# Patient Record
Sex: Male | Born: 1990 | Race: Black or African American | Hispanic: No | Marital: Single | Smoking: Never smoker
Health system: Southern US, Community
[De-identification: ages and names within clinical notes are randomized; demographics above are authoritative.]

## PROBLEM LIST (undated history)

## (undated) HISTORY — PX: OTHER SURGICAL HISTORY: SHX169

---
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
## 2010-07-15 ENCOUNTER — Encounter: Payer: Self-pay | Admitting: Family Medicine

## 2010-09-10 LAB — DIFFERENTIAL
Basophils Relative: 0 % (ref 0–1)
Eosinophils Absolute: 0.2 10*3/uL (ref 0.0–0.7)
Lymphs Abs: 0.6 10*3/uL — ABNORMAL LOW (ref 0.7–4.0)
Neutrophils Relative %: 82 % — ABNORMAL HIGH (ref 43–77)

## 2010-09-10 LAB — BASIC METABOLIC PANEL
BUN: 13 mg/dL (ref 6–23)
CO2: 26 mEq/L (ref 19–32)
Calcium: 8.8 mg/dL (ref 8.4–10.5)
Chloride: 106 mEq/L (ref 96–112)
Creatinine, Ser: 1.12 mg/dL (ref 0.4–1.5)
GFR calc Af Amer: >60 mL/min (ref >60)
GFR calc non Af Amer: >60 mL/min (ref >60)
Glucose, Bld: 100 mg/dL — ABNORMAL HIGH (ref 70–99)
Potassium: 3.6 mEq/L (ref 3.5–5.1)
Sodium: 137 mEq/L (ref 135–145)

## 2010-09-10 LAB — CBC
HCT: 39.5 % (ref 39.0–52.0)
Hemoglobin: 12.7 g/dL — ABNORMAL LOW (ref 13.0–17.0)
MCHC: 32.1 g/dL (ref 30.0–36.0)
MCV: 84 fL (ref 78.0–100.0)
Platelets: 157 10*3/uL (ref 150–400)
RBC: 4.7 MIL/uL (ref 4.22–5.81)
RDW: 14.6 % (ref 11.5–15.5)
WBC: 8.8 10*3/uL (ref 4.0–10.5)

## 2010-09-10 LAB — D-DIMER, QUANTITATIVE: D-Dimer, Quant: 0.24 ug/mL-FEU (ref 0.00–0.48)

## 2010-09-10 LAB — RAPID STREP SCREEN (MED CTR MEBANE ONLY): Streptococcus, Group A Screen (Direct): NEGATIVE

## 2010-09-10 LAB — CK TOTAL AND CKMB (NOT AT ARMC): Total CK: 278 U/L — ABNORMAL HIGH (ref 7–232)

## 2011-01-28 ENCOUNTER — Encounter: Payer: Self-pay | Admitting: *Deleted

## 2011-01-28 DIAGNOSIS — J029 Acute pharyngitis, unspecified: Secondary | ICD-10-CM | POA: Insufficient documentation

## 2011-01-28 NOTE — ED Notes (Signed)
Sore throat x 3 days

## 2011-01-29 ENCOUNTER — Emergency Department (HOSPITAL_BASED_OUTPATIENT_CLINIC_OR_DEPARTMENT_OTHER)
Admission: EM | Admit: 2011-01-29 | Discharge: 2011-01-29 | Disposition: A | Discharge status: Home / Self Care | Payer: Federal, State, Local not specified - PPO | Attending: Emergency Medicine | Admitting: Emergency Medicine

## 2011-01-29 DIAGNOSIS — J029 Acute pharyngitis, unspecified: Secondary | ICD-10-CM

## 2011-01-29 MED ORDER — LIDOCAINE HCL (PF) 1 % IJ SOLN
INTRAMUSCULAR | Status: AC
  Administered 2011-01-29: 2.1 mL via INTRAMUSCULAR
  Filled 2011-01-29: qty 5

## 2011-01-29 MED ORDER — DEXAMETHASONE SODIUM PHOSPHATE 10 MG/ML IJ SOLN
10.0000 mg | Freq: Once | INTRAMUSCULAR | Status: AC
  Administered 2011-01-29: 10 mg via INTRAMUSCULAR
  Filled 2011-01-29: qty 1

## 2011-01-29 MED ORDER — CEFTRIAXONE SODIUM 1 G IJ SOLR
1.0000 g | Freq: Once | INTRAMUSCULAR | Status: DC
  Filled 2011-01-29: qty 1

## 2011-01-29 MED ORDER — AMOXICILLIN-POT CLAVULANATE 875-125 MG PO TABS: 1.0000 | ORAL_TABLET | Freq: Two times a day (BID) | ORAL | Status: AC

## 2011-01-29 MED ORDER — DEXTROSE 5 % IV SOLN
INTRAVENOUS | Status: AC
  Administered 2011-01-29: 1 g via INTRAMUSCULAR
  Filled 2011-01-29: qty 1

## 2011-01-29 NOTE — ED Provider Notes (Signed)
History     CSN: [id number] Arrival date & time: No admission date for patient encounter.  Chief Complaint  Patient presents with  . Sore Throat   Patient is a 20 y.o. male presenting with pharyngitis. The history is provided by the patient.  Sore Throat This is a new problem. The current episode started more than 2 days ago (3 days ago). The problem occurs constantly. The problem has not changed since onset.Pertinent negatives include no chest pain, no abdominal pain, no headaches and no shortness of breath. The symptoms are relieved by nothing. He has tried nothing for the symptoms.  pt c/o sore throat for past 3 days. No acute or abrupt change today. Hurts to swallow but is able to swallow food/liquid without difficulty. No sob. Denies hx frequent sore throats. No known ill contacts. No known strep or mono exposure. No cough or sob. No nasal congestion or drainage. No fever or chills. No fb sensation. No abd or flank pain. No cp. No neck pain or stiffness. No headache.  Patient is a 20 year old male c/o sore throat onset 3 days ago and persistent since.   PAST MEDICAL HISTORY:  History reviewed. No pertinent past medical history.  PAST SURGICAL HISTORY:  History reviewed. No pertinent past surgical history.  MEDICATIONS:  Previous Medications   No medications on file     ALLERGIES:  Allergies as of 01/28/2011  . (No Known Allergies)     FAMILY HISTORY:  No family history on file.   SOCIAL HISTORY: History   Social History  . Marital Status: Single    Spouse Name: N/A    Number of Children: N/A  . Years of Education: N/A   Social History Main Topics  . Smoking status: Never Smoker   . Smokeless tobacco: None  . Alcohol Use: No  . Drug Use: No  . Sexually Active:    Other Topics Concern  . None   Social History Narrative  . None      Review of Systems  Constitutional: Negative for fever.  HENT: Negative for neck pain and neck stiffness.   Eyes: Negative  for pain.  Respiratory: Negative for shortness of breath.   Cardiovascular: Negative for chest pain.  Gastrointestinal: Negative for abdominal pain.  Musculoskeletal: Negative for back pain.  Skin: Negative for rash.  Neurological: Negative for headaches.  Hematological: Does not bruise/bleed easily.  Psychiatric/Behavioral: Negative for behavioral problems.   10 Systems reviewed and are negative for acute change except as noted in the HPI.  Physical Exam  BP 126/70  Pulse 89  Temp(Src) 99.3 F (37.4 C) (Oral)  Resp 20  SpO2 98%  Physical Exam  Nursing note and vitals reviewed. Constitutional: He is oriented to person, place, and time. He appears well-developed and well-nourished. No distress.  HENT:  Head: Atraumatic.       Pharynx erythematous. Mild symmetric swelling. No asymmetry, abscess, or trismus noted. Mild exudate.   Eyes: Pupils are equal, round, and reactive to light.  Neck: Neck supple. No tracheal deviation present.       Ant cerv l/a. No masses  Cardiovascular: Normal rate.   Pulmonary/Chest: Effort normal and breath sounds normal. No accessory muscle usage. No respiratory distress. He has no rales.       No increased wob, no stridor. No wheezing.   Abdominal: Soft. Bowel sounds are normal. He exhibits no distension and no mass. There is no tenderness.       No hsm  Musculoskeletal: Normal range of motion.  Neurological: He is alert and oriented to person, place, and time.       Voice normal  Skin: Skin is warm and dry.  Psychiatric: He has a normal mood and affect.    ED Course  Procedures  MDM Pt notes soreness/pain a bit worse on right side of throat. No gross asymmetry/abscess noted on exam. Discussed diff dx w pt including pharyngitis, possible early periton abscess. Will rx abx, and discussed w pt plan for ent referral and to call this am to arrange follow up there later today.   Rocephin and decadron in ed.    Results for orders placed during  the hospital encounter of 01/29/11  RAPID STREP SCREEN      Component Value Range   Streptococcus, Group A Screen (Direct) NEGATIVE  NEGATIVE       Suzi Roots, MD 01/29/11 [phone]

## 2011-01-29 NOTE — ED Notes (Signed)
No reaction noted to ABX. Pt discharged home. 

## 2011-01-29 NOTE — ED Provider Notes (Signed)
History     CSN: [id number] Arrival date & time: No admission date for patient encounter.  Chief Complaint  Patient presents with  . Sore Throat   HPI  History reviewed. No pertinent past medical history.  History reviewed. No pertinent past surgical history.  No family history on file.  History  Substance Use Topics  . Smoking status: Never Smoker   . Smokeless tobacco: Not on file  . Alcohol Use: No      Review of Systems  Physical Exam  BP 126/70  Pulse 89  Temp(Src) 99.3 F (37.4 C) (Oral)  Resp 20  SpO2 98%  Physical Exam  ED Course  Procedures  MDM       Suzi Roots, MD 01/29/11 [phone]

## 2011-01-29 NOTE — ED Notes (Signed)
MD at bedside. DR Steinl at bedside. 

## 2011-01-29 NOTE — ED Notes (Signed)
Pt reports sore throat x4 days. Denies any other associated symptoms. Tonsils are enlarged during exam in room.

## 2011-02-05 ENCOUNTER — Encounter (HOSPITAL_BASED_OUTPATIENT_CLINIC_OR_DEPARTMENT_OTHER): Payer: Self-pay | Admitting: *Deleted

## 2011-02-05 ENCOUNTER — Emergency Department (INDEPENDENT_AMBULATORY_CARE_PROVIDER_SITE_OTHER): Payer: Federal, State, Local not specified - PPO

## 2011-02-05 ENCOUNTER — Emergency Department (HOSPITAL_BASED_OUTPATIENT_CLINIC_OR_DEPARTMENT_OTHER)
Admission: EM | Admit: 2011-02-05 | Discharge: 2011-02-05 | Disposition: A | Discharge status: Home / Self Care | Payer: Federal, State, Local not specified - PPO | Attending: Emergency Medicine | Admitting: Emergency Medicine

## 2011-02-05 DIAGNOSIS — J36 Peritonsillar abscess: Secondary | ICD-10-CM | POA: Insufficient documentation

## 2011-02-05 DIAGNOSIS — R1310 Dysphagia, unspecified: Secondary | ICD-10-CM | POA: Insufficient documentation

## 2011-02-05 DIAGNOSIS — J351 Hypertrophy of tonsils: Secondary | ICD-10-CM

## 2011-02-05 LAB — DIFFERENTIAL
Eosinophils Absolute: 0.2 10*3/uL (ref 0.0–0.7)
Eosinophils Relative: 2 % (ref 0–5)
Lymphs Abs: 1.4 10*3/uL (ref 0.7–4.0)
Monocytes Relative: 8 % (ref 3–12)

## 2011-02-05 LAB — CBC
HCT: 38.6 % — ABNORMAL LOW (ref 39.0–52.0)
Hemoglobin: 12.6 g/dL — ABNORMAL LOW (ref 13.0–17.0)
MCH: 26.1 pg (ref 26.0–34.0)
MCV: 79.9 fL (ref 78.0–100.0)
RBC: 4.83 MIL/uL (ref 4.22–5.81)

## 2011-02-05 LAB — BASIC METABOLIC PANEL
BUN: 12 mg/dL (ref 6–23)
Calcium: 9.6 mg/dL (ref 8.4–10.5)
GFR calc non Af Amer: >60 mL/min (ref >60)
Glucose, Bld: 104 mg/dL — ABNORMAL HIGH (ref 70–99)
Potassium: 3.9 mEq/L (ref 3.5–5.1)

## 2011-02-05 MED ORDER — CLINDAMYCIN PHOSPHATE 900 MG/50ML IV SOLN
900.0000 mg | Freq: Once | INTRAVENOUS | Status: AC
  Administered 2011-02-05: 900 mg via INTRAVENOUS
  Filled 2011-02-05: qty 50

## 2011-02-05 MED ORDER — HYDROCODONE-ACETAMINOPHEN 5-500 MG PO TABS
1.0000 | ORAL_TABLET | Freq: Four times a day (QID) | ORAL | Status: AC | PRN
Start: 2011-02-05 — End: 2011-02-15

## 2011-02-05 MED ORDER — SODIUM CHLORIDE 0.9 % IV BOLUS (SEPSIS): 1000.0000 mL | Freq: Once | INTRAVENOUS | Status: DC

## 2011-02-05 MED ORDER — DEXAMETHASONE SODIUM PHOSPHATE 4 MG/ML IJ SOLN
10.0000 mg | Freq: Once | INTRAMUSCULAR | Status: AC
  Administered 2011-02-05: 10 mg via INTRAVENOUS
  Filled 2011-02-05: qty 2
  Filled 2011-02-05: qty 1

## 2011-02-05 MED ORDER — IOHEXOL 300 MG/ML  SOLN
80.0000 mL | Freq: Once | INTRAMUSCULAR | Status: AC | PRN
  Administered 2011-02-05: 80 mL via INTRAVENOUS

## 2011-02-05 MED ORDER — MORPHINE SULFATE 4 MG/ML IJ SOLN
4.0000 mg | Freq: Once | INTRAMUSCULAR | Status: AC
  Administered 2011-02-05: 4 mg via INTRAVENOUS
  Filled 2011-02-05: qty 1

## 2011-02-05 MED ORDER — SODIUM CHLORIDE 0.9 % IV BOLUS (SEPSIS)
1000.0000 mL | Freq: Once | INTRAVENOUS | Status: DC
  Administered 2011-02-05: 1000 mL via INTRAVENOUS

## 2011-02-05 MED ORDER — CLINDAMYCIN HCL 150 MG PO CAPS: 450.0000 mg | ORAL_CAPSULE | Freq: Three times a day (TID) | ORAL | Status: AC

## 2011-02-05 NOTE — ED Notes (Signed)
Pt sipping on clear fluids. Able to tolerate small sips at a time

## 2011-02-05 NOTE — ED Provider Notes (Signed)
History     CSN: [id number] Arrival date & time: 02/05/2011  4:48 AM  Chief Complaint  Patient presents with  . Sore Throat   HPI Comments: 20yoM previously healthy pw sore throat. Pt c/o sore throat x over one week. Seen here previously for same with negative rapid strep. Given decadron and prescribed augmentin. Did not take 2/2 inability to swallow pills. C/o worsening pain and swelling > right side since yesterday. Feels short of breath 2/2 feeling like "throat is closing". Denies noisy breathing. Tolerating PO liquids, having more difficulty with solids. +muffled voice, no trismus. Denies h/o DM, HIV, immunocompromise. No neck stiffness, denies fever, no vomiting  Patient is a 20 y.o. male presenting with pharyngitis.  Sore Throat    History reviewed. No pertinent past medical history.  History reviewed. No pertinent past surgical history.  No family history on file.  History  Substance Use Topics  . Smoking status: Never Smoker   . Smokeless tobacco: Not on file  . Alcohol Use: No      Review of Systems  All other systems reviewed and are negative.  except as noted HPI   Physical Exam  BP 131/68  Pulse 92  Temp(Src) 98.7 F (37.1 C) (Oral)  Resp 18  SpO2 98%  Physical Exam  Nursing note and vitals reviewed. Constitutional: He is oriented to person, place, and time. He appears well-developed and well-nourished. No distress.  HENT:  Head: Atraumatic.  Mouth/Throat: Oropharyngeal exudate present.       3+ tonsillar swelling R>L uvula deviated to left +erythema B/l tender submandibular LAD No stridor No trismus-- 3 finger breadths  Eyes: Conjunctivae and EOM are normal. Pupils are equal, round, and reactive to light.  Neck: Neck supple.  Cardiovascular: Normal rate, regular rhythm, normal heart sounds and intact distal pulses.  Exam reveals no gallop and no friction rub.   No murmur heard. Pulmonary/Chest: Effort normal. No respiratory distress. He has no  wheezes. He has no rales.  Abdominal: Soft. Bowel sounds are normal. There is no tenderness. There is no rebound and no guarding.  Musculoskeletal: Normal range of motion. He exhibits no edema and no tenderness.  Neurological: He is alert and oriented to person, place, and time.  Skin: Skin is warm and dry.  Psychiatric: He has a normal mood and affect.   *RADIOLOGY REPORT*  Clinical Data: Difficulty swallowing and feels like airways  closing. Treated with antibiotics several weeks ago. Evaluate for  right tonsillar abscess.  CT NECK WITH CONTRAST  Technique: Multidetector CT imaging of the neck was performed with  intravenous contrast.  Contrast: 80 ml Omnipaque-300  Comparison: None.  Findings: There is diffuse tonsillar swelling, more prominent on  the right. Small circumscribed hypodense foci are demonstrated  within the right tonsil with mild peripheral enhancement. These  measure about 11 mm and 5 mm diameter. These are consistent with  early abscess formation. There is infiltration into the adjacent  cervical fat consistent with inflammatory change. Mildly prominent  cervical lymph nodes demonstrated diffusely and bilaterally  consistent with reactive nodes. In addition, there is a small  circumscribed fluid collection along the midline just above the  hyoid bone measuring about 10 mm diameter. This could represent  inflammatory or necrotic lymph node or a preexisting small  thyroglossal cyst. The cervical carotid and jugular vessels appear  patent. Salivary glands appear symmetrical. Visualized paranasal  sinuses are not opacified. Thyroid gland is intact with  homogeneous parenchymal enhancement.  IMPRESSION:  Diffuse tonsillar enlargement, more prominent on the right, with  small developing circumscribed fluid collections in the right  tonsils suggesting small abscesses. There is another small cystic  collection anterior to the hyoid bone which might represent a  cystic  lymph node or preexisting thyroglossal cyst.  Original Report Authenticated By: Marlon Pel, M.D.   ED Course  Procedures  MDM 20yoM here with persistent sore throat and worsening swelling. Will check CT evaluate for peritonsillar abscess. Decadron. IVF, pain control, reassess 6:20 AM  CT scan and labs reviewed. Mild leukocytosis. +Two small R peritonsillar abscesses. Discussed with ENT Dr. Pollyann Kennedy. Will give clindamycin here. He will see patient in the office this morning at 0900am for further evaluation and possible drainage assuming pt can tolerate PO fluids. Patient is comfortable with this plan for follow up.  6:52 AM  Pain down to 4/10. Tolerating PO fluids, states he feels much better. Father coming to pick him up and will take him to his morning appointment with ENT  Eden Lathe, MD 02/05/11 [phone]

## 2011-02-05 NOTE — ED Notes (Signed)
Pt states he is here tonight bc he feels like his airway is closing. Pt states he was prescribed Augmentin when he was seen here over a week ago, which he did not finish taking "bc it hurt too much to swallow them".

## 2011-02-05 NOTE — ED Notes (Signed)
Pt seen here a couple weeks ago for sore throat (that he's had for three weeks) and given steroid injection. States pain has worsened, and he can barely swallow liquids. Pt has difficulty speaking d/t pain. Pt c/o "knot" to right side of throat.

## 2011-02-05 NOTE — ED Notes (Signed)
Pt states that he feels like he cant breath and his tonsils on observation per RN were severely swollen.

## 2011-02-06 ENCOUNTER — Ambulatory Visit (INDEPENDENT_AMBULATORY_CARE_PROVIDER_SITE_OTHER): Payer: Federal, State, Local not specified - PPO | Admitting: *Deleted

## 2011-02-06 DIAGNOSIS — Z23 Encounter for immunization: Secondary | ICD-10-CM

## 2011-02-06 DIAGNOSIS — Z111 Encounter for screening for respiratory tuberculosis: Secondary | ICD-10-CM

## 2011-02-11 NOTE — Patient Instructions (Signed)
Pt didn't return for ppd reading

## 2011-04-29 ENCOUNTER — Encounter (HOSPITAL_BASED_OUTPATIENT_CLINIC_OR_DEPARTMENT_OTHER): Payer: Self-pay | Admitting: *Deleted

## 2011-04-29 ENCOUNTER — Emergency Department (HOSPITAL_BASED_OUTPATIENT_CLINIC_OR_DEPARTMENT_OTHER)
Admission: EM | Admit: 2011-04-29 | Discharge: 2011-04-29 | Disposition: A | Discharge status: Home / Self Care | Payer: Federal, State, Local not specified - PPO | Attending: Emergency Medicine | Admitting: Emergency Medicine

## 2011-04-29 DIAGNOSIS — J039 Acute tonsillitis, unspecified: Secondary | ICD-10-CM

## 2011-04-29 DIAGNOSIS — J020 Streptococcal pharyngitis: Secondary | ICD-10-CM | POA: Insufficient documentation

## 2011-04-29 LAB — CBC
HCT: 39.7 % (ref 39.0–52.0)
MCH: 25.9 pg — ABNORMAL LOW (ref 26.0–34.0)
MCV: 79.7 fL (ref 78.0–100.0)
Platelets: 240 10*3/uL (ref 150–400)
RBC: 4.98 MIL/uL (ref 4.22–5.81)

## 2011-04-29 LAB — DIFFERENTIAL
Eosinophils Absolute: 0 10*3/uL (ref 0.0–0.7)
Eosinophils Relative: 0 % (ref 0–5)
Lymphs Abs: 0.9 10*3/uL (ref 0.7–4.0)
Monocytes Absolute: 1.4 10*3/uL — ABNORMAL HIGH (ref 0.1–1.0)

## 2011-04-29 MED ORDER — DEXAMETHASONE SODIUM PHOSPHATE 10 MG/ML IJ SOLN
10.0000 mg | Freq: Once | INTRAMUSCULAR | Status: AC
  Administered 2011-04-29: 10 mg via INTRAVENOUS
  Filled 2011-04-29: qty 1

## 2011-04-29 MED ORDER — MORPHINE SULFATE 4 MG/ML IJ SOLN
4.0000 mg | Freq: Once | INTRAMUSCULAR | Status: AC
  Administered 2011-04-29: 4 mg via INTRAVENOUS
  Filled 2011-04-29: qty 1

## 2011-04-29 MED ORDER — SODIUM CHLORIDE 0.9 % IV SOLN
Freq: Once | INTRAVENOUS | Status: AC
  Administered 2011-04-29: 21:00:00 via INTRAVENOUS

## 2011-04-29 MED ORDER — OXYCODONE-ACETAMINOPHEN 5-325 MG PO TABS: 2.0000 | ORAL_TABLET | ORAL | Status: AC | PRN

## 2011-04-29 MED ORDER — CEPHALEXIN 500 MG PO CAPS: 500.0000 mg | ORAL_CAPSULE | Freq: Four times a day (QID) | ORAL | Status: AC

## 2011-04-29 MED ORDER — PREDNISONE 10 MG PO TABS: 20.0000 mg | ORAL_TABLET | Freq: Every day | ORAL | Status: AC

## 2011-04-29 MED ORDER — DEXTROSE 5 % IV SOLN
1.0000 g | Freq: Once | INTRAVENOUS | Status: AC
  Administered 2011-04-29: 1 g via INTRAVENOUS
  Filled 2011-04-29: qty 10

## 2011-04-29 MED ORDER — HYDROCODONE-ACETAMINOPHEN 5-500 MG PO TABS: 1.0000 | ORAL_TABLET | Freq: Four times a day (QID) | ORAL | Status: DC | PRN

## 2011-04-29 NOTE — ED Provider Notes (Signed)
History    Scribed for Geoffery Lyons, MD, the patient was seen in room MH04/MH04. This chart was scribed by Katha Cabal.   CSN: [id number] Arrival date & time: 04/29/2011  8:18 PM   First MD Initiated Contact with Patient 04/29/11 2020      Chief Complaint  Patient presents with  . Sore Throat    (Consider location/radiation/quality/duration/timing/severity/associated sxs/prior treatment) HPI Nicholas Shaw is a 20 y.o. male who presents to the Emergency Department complaining of new episode of recurrent persistent moderate to severe sore throat episode for 2 days.  Sore throat associated with difficulty swallowing and throat pain.  Symptoms are gradually worsening.  Relieved by nothing.  Brother reports 2 prior episodes.  Last episode was a month ago with first episode was about a week before.  Patient was given antibiotics and steroids at last visit that provided relief.  Patient has no history of serious medical conditions.   PCP Willow Ora, MD, MD   History reviewed. No pertinent past medical history.  History reviewed. No pertinent past surgical history.  No family history on file.  History  Substance Use Topics  . Smoking status: Never Smoker   . Smokeless tobacco: Not on file  . Alcohol Use: No      Review of Systems  All other systems reviewed and are negative.    Allergies  Review of patient's allergies indicates no known allergies.  Home Medications  No current outpatient prescriptions on file.  BP 135/56  Pulse 96  Temp(Src) 98.9 F (37.2 C) (Oral)  Resp 16  SpO2 96%  Physical Exam  Constitutional: He is oriented to person, place, and time. He appears well-developed and well-nourished.  HENT:  Head: Normocephalic and atraumatic.  Mouth/Throat: Uvula is midline. Mucous membranes are not dry. Oropharyngeal exudate, posterior oropharyngeal edema and posterior oropharyngeal erythema present.       Markedly enlarged tonsils bilaterally, Symmetrical  tonsils,   Eyes: Conjunctivae and EOM are normal.  Cardiovascular: Normal rate and regular rhythm.   Pulmonary/Chest: Effort normal and breath sounds normal. No respiratory distress.  Abdominal: There is no tenderness. There is no rebound and no guarding.  Musculoskeletal: Normal range of motion.  Neurological: He is alert and oriented to person, place, and time.  Skin: Skin is warm and dry. No rash noted.  Psychiatric: He has a normal mood and affect. His behavior is normal.    ED Course  Procedures (including critical care time)   DIAGNOSTIC STUDIES: Oxygen Saturation is 98% on room air, normal by my interpretation.    COORDINATION OF CARE:  8:43 PM  Physical exam complete. Swabbed throat for rapid strep.   9:35 PM  Patient with positive rapid strep. Patient advised of result.   10:18 PM  Recheck.  Patient reports improvement.  Plan to discharge patient. Patient agrees with plan.    Orders Placed This Encounter  Procedures  . Rapid strep screen  . CBC  . Differential     LABS / RADIOLOGY: Labs Reviewed  RAPID STREP SCREEN - Abnormal; Notable for the following:    Streptococcus, Group A Screen (Direct) POSITIVE (*)    All other components within normal limits  CBC - Abnormal; Notable for the following:    WBC 15.7 (*)    Hemoglobin 12.9 (*)    MCH 25.9 (*)    All other components within normal limits  DIFFERENTIAL - Abnormal; Notable for the following:    Neutrophils Relative 85 (*)    Neutro  Abs 13.4 (*)    Lymphocytes Relative 6 (*)    Monocytes Absolute 1.4 (*)    All other components within normal limits   No results found.       MDM   MDM: Patient with significant tonsillar swelling and exudates.  Was given iv antibiotics, steroids, feeling better.  Will discharge with steroids, antibiotics, and pain meds.  Needs to follow up with ENT.       MEDICATIONS GIVEN IN THE E.D. Scheduled Meds:    . sodium chloride   Intravenous Once  . dexamethasone   10 mg Intravenous Once  .  morphine injection  4 mg Intravenous Once   Continuous Infusions:    . cefTRIAXone (ROCEPHIN) IV         IMPRESSION: 1. Strep pharyngitis   2. Tonsillitis      DISCHARGE MEDICATIONS: New Prescriptions   No medications on file      I personally performed the services described in this documentation, which was scribed in my presence. The recorded information has been reviewed and considered.  Scribe            Geoffery Lyons, MD 04/30/11 1534

## 2011-04-29 NOTE — ED Notes (Signed)
Pt c/o sore throat x2 days 

## 2012-01-01 ENCOUNTER — Encounter: Payer: Self-pay | Admitting: Internal Medicine

## 2012-01-01 ENCOUNTER — Ambulatory Visit (INDEPENDENT_AMBULATORY_CARE_PROVIDER_SITE_OTHER): Payer: Federal, State, Local not specified - PPO | Admitting: Internal Medicine

## 2012-01-01 VITALS — BP 132/78 | HR 59 | Temp 99.0°F | Wt 295.8 lb

## 2012-01-01 DIAGNOSIS — IMO0002 Reserved for concepts with insufficient information to code with codable children: Secondary | ICD-10-CM

## 2012-01-01 MED ORDER — CYCLOBENZAPRINE HCL 5 MG PO TABS: 5.0000 mg | ORAL_TABLET | Freq: Three times a day (TID) | ORAL | Status: AC | PRN

## 2012-01-01 MED ORDER — TRAMADOL HCL 50 MG PO TABS: 50.0000 mg | ORAL_TABLET | Freq: Four times a day (QID) | ORAL | Status: AC | PRN

## 2012-01-01 NOTE — Patient Instructions (Addendum)
Please remain out of work until  01/02/2012. Stretching and warming up prior to weight lifting.  The best exercises for the low back include freestyle swimming, stretch aerobics, and yoga.

## 2012-01-01 NOTE — Progress Notes (Signed)
  Subjective:    Patient ID: Nicholas Shaw, male    DOB: [birth year], 21 y.o.   MRN: [id number]  HPI BACK PAIN: He experienced the acute onset of left flank pain this morning after dead lifting approximately 215 pounds. The pain is described as sharp, non radiating and intermittent. He has not treated this in any fashion. He had a similar episode on one occasion after wrestling.  His past medical history is totally negative; specifically no history of renal calculi    Review of Systems There's been no associated stool urinary incontinence, numbness, extremity weakness, fever, chills,or sweats. He also denies hematuria, pyuria, or dysuria.     Objective:   Physical Exam   He appears healthy and well-nourished  He exhibits a bradycardia with a regular rhythm.  Chest is clear the bases with no splinting  He is tender to palpation over the left posterior flank.  Bowel sounds are normal; he exhibits no tenderness, masses, organomegaly  Is able to lie flat and sit up without help. Straight leg raising is negative.  Deep tendon reflexes, strength, and tone are all normal. Gait, tiptoe walking and heel walking are all normal.        Assessment & Plan:  #1 acute left flank pain in the context of weight lifting. Clinically no suggestion of acute disc, intra-abdominal process or nephrolithiasis.  Plan: See orders and recommendations

## 2013-07-29 ENCOUNTER — Encounter (HOSPITAL_COMMUNITY): Payer: Self-pay | Admitting: Emergency Medicine

## 2013-07-29 ENCOUNTER — Emergency Department (HOSPITAL_COMMUNITY)
Admission: EM | Admit: 2013-07-29 | Discharge: 2013-07-29 | Disposition: A | Discharge status: Home / Self Care | Payer: Federal, State, Local not specified - PPO | Attending: Emergency Medicine | Admitting: Emergency Medicine

## 2013-07-29 DIAGNOSIS — R42 Dizziness and giddiness: Secondary | ICD-10-CM | POA: Insufficient documentation

## 2013-07-29 DIAGNOSIS — R51 Headache: Secondary | ICD-10-CM | POA: Insufficient documentation

## 2013-07-29 DIAGNOSIS — K5289 Other specified noninfective gastroenteritis and colitis: Secondary | ICD-10-CM | POA: Insufficient documentation

## 2013-07-29 DIAGNOSIS — R6883 Chills (without fever): Secondary | ICD-10-CM | POA: Insufficient documentation

## 2013-07-29 DIAGNOSIS — R0602 Shortness of breath: Secondary | ICD-10-CM | POA: Insufficient documentation

## 2013-07-29 DIAGNOSIS — K529 Noninfective gastroenteritis and colitis, unspecified: Secondary | ICD-10-CM

## 2013-07-29 LAB — COMPREHENSIVE METABOLIC PANEL
ALBUMIN: 3.8 g/dL (ref 3.5–5.2)
ALT: 21 U/L (ref 0–53)
AST: 21 U/L (ref 0–37)
Alkaline Phosphatase: 53 U/L (ref 39–117)
BILIRUBIN TOTAL: 0.6 mg/dL (ref 0.3–1.2)
BUN: 16 mg/dL (ref 6–23)
CHLORIDE: 100 meq/L (ref 96–112)
CO2: 24 mEq/L (ref 19–32)
CREATININE: 1.05 mg/dL (ref 0.50–1.35)
Calcium: 8.7 mg/dL (ref 8.4–10.5)
GFR calc non Af Amer: >90 mL/min (ref >90)
GLUCOSE: 110 mg/dL — AB (ref 70–99)
Potassium: 4.6 mEq/L (ref 3.7–5.3)
Sodium: 136 mEq/L — ABNORMAL LOW (ref 137–147)
Total Protein: 7.4 g/dL (ref 6.0–8.3)

## 2013-07-29 LAB — CBC WITH DIFFERENTIAL/PLATELET
Basophils Absolute: 0 10*3/uL (ref 0.0–0.1)
Basophils Relative: 0 % (ref 0–1)
Eosinophils Absolute: 0.1 10*3/uL (ref 0.0–0.7)
Eosinophils Relative: 1 % (ref 0–5)
HCT: 43.4 % (ref 39.0–52.0)
Hemoglobin: 14.4 g/dL (ref 13.0–17.0)
Lymphocytes Relative: 3 % — ABNORMAL LOW (ref 12–46)
Lymphs Abs: 0.3 10*3/uL — ABNORMAL LOW (ref 0.7–4.0)
MCH: 27.6 pg (ref 26.0–34.0)
MCHC: 33.2 g/dL (ref 30.0–36.0)
MCV: 83.1 fL (ref 78.0–100.0)
Monocytes Absolute: 1.1 10*3/uL — ABNORMAL HIGH (ref 0.1–1.0)
Monocytes Relative: 11 % (ref 3–12)
Neutro Abs: 8.3 10*3/uL — ABNORMAL HIGH (ref 1.7–7.7)
Neutrophils Relative %: 85 % — ABNORMAL HIGH (ref 43–77)
Platelets: 166 10*3/uL (ref 150–400)
RBC: 5.22 MIL/uL (ref 4.22–5.81)
RDW: 13.5 % (ref 11.5–15.5)
WBC: 9.7 10*3/uL (ref 4.0–10.5)

## 2013-07-29 LAB — URINALYSIS, ROUTINE W REFLEX MICROSCOPIC
Bilirubin Urine: NEGATIVE
Glucose, UA: NEGATIVE mg/dL
Hgb urine dipstick: NEGATIVE
Ketones, ur: NEGATIVE mg/dL
Leukocytes, UA: NEGATIVE
Nitrite: NEGATIVE
Protein, ur: NEGATIVE mg/dL
Specific Gravity, Urine: 1.03 (ref 1.005–1.030)
Urobilinogen, UA: 0.2 mg/dL (ref 0.0–1.0)
pH: 7 (ref 5.0–8.0)

## 2013-07-29 LAB — LIPASE, BLOOD: LIPASE: 32 U/L (ref 11–59)

## 2013-07-29 MED ORDER — SODIUM CHLORIDE 0.9 % IV BOLUS (SEPSIS)
500.0000 mL | INTRAVENOUS | Status: AC
  Administered 2013-07-29: 500 mL via INTRAVENOUS

## 2013-07-29 MED ORDER — PROMETHAZINE HCL 25 MG PO TABS: 25.0000 mg | ORAL_TABLET | Freq: Three times a day (TID) | ORAL | Status: DC | PRN

## 2013-07-29 MED ORDER — SODIUM CHLORIDE 0.9 % IV BOLUS (SEPSIS)
2000.0000 mL | Freq: Once | INTRAVENOUS | Status: AC
  Administered 2013-07-29: 2000 mL via INTRAVENOUS

## 2013-07-29 MED ORDER — ONDANSETRON HCL 4 MG/2ML IJ SOLN
4.0000 mg | Freq: Once | INTRAMUSCULAR | Status: AC
  Administered 2013-07-29: 4 mg via INTRAVENOUS
  Filled 2013-07-29: qty 2

## 2013-07-29 NOTE — Discharge Instructions (Signed)
Return here as needed.  Follow-up with your primary doctor.  Slowly increase your fluid intake. °

## 2013-07-29 NOTE — ED Provider Notes (Signed)
CSN: [id number]     Arrival date & time 07/29/13  0556 History   First MD Initiated Contact with Patient 07/29/13 [phone]     Chief Complaint  Patient presents with  . Abdominal Pain  . Emesis  . Diarrhea   (Consider location/radiation/quality/duration/timing/severity/associated sxs/prior Treatment) HPI 23 yo black male presents with chief complaint of diarrhea and emesis that started at midnight, 6.5 hours ago. He went to a [place] buff[place] with his family around 2 pm and started having abdominal pain around 9 pm, which progressed to vomiting at midnight and diarrhea shortly thereafter. He reports non-bloody vomiting every 10-15 minutes and having non-bloody diarrhea. Patient describes the abdominal pain as a 10/10 on the pain scale and has taken 200 mg Acetaminophen for relief. He also endorses headache, dizziness.   History reviewed. No pertinent past medical history. Past Surgical History  Procedure Laterality Date  . Negative     Family History  Problem Relation Age of Onset  . Hypertension Other    History  Substance Use Topics  . Smoking status: Never Smoker   . Smokeless tobacco: Not on file  . Alcohol Use: Yes     Comment: occ    Review of Systems  Constitutional: Positive for chills.  Eyes: Negative.   Respiratory: Positive for shortness of breath.   Cardiovascular: Negative for chest pain.  Gastrointestinal: Positive for nausea, vomiting, abdominal pain and diarrhea. Negative for constipation, blood in stool and anal bleeding.  Neurological: Positive for dizziness, light-headedness and headaches.    Allergies  Review of patient's allergies indicates no known allergies.  Home Medications  No current outpatient prescriptions on file. BP 133/59  Pulse 101  Temp(Src) 99.7 F (37.6 C) (Oral)  Resp 16  SpO2 97% Physical Exam  Constitutional: He appears well-developed and well-nourished. He appears distressed.  HENT:  Head: Normocephalic and atraumatic.   Mouth/Throat: No oropharyngeal exudate.  Eyes: Pupils are equal, round, and reactive to light.  Cardiovascular: Normal rate, regular rhythm and normal heart sounds.  Exam reveals no gallop and no friction rub.   No murmur heard. Pulmonary/Chest: Effort normal and breath sounds normal. He has no wheezes. He has no rhonchi. He has no rales. He exhibits no tenderness.  Abdominal: He exhibits abdominal bruit. Bowel sounds are decreased. There is no hepatosplenomegaly. There is tenderness in the epigastric area and left upper quadrant. There is no rebound, no guarding, no CVA tenderness, no tenderness at McBurney's point and negative Murphy's sign.    ED Course  Procedures (including critical care   Patient is feeling dramatically better following IV fluids.  Told to return here as needed.  Slowly increase his fluid intake At home.vision is a viral gastroenteritis, based on his history of present illness and physical exam findings  Carlyle DollyChristopher W Avey Mcmanamon, PA-C 07/30/13 1621

## 2013-07-29 NOTE — ED Notes (Signed)
Pt alert and oriented x4. Respirations even and unlabored, bilateral symmetrical rise and fall of chest. Skin warm and dry. In no acute distress. Denies needs.   

## 2013-07-29 NOTE — ED Notes (Signed)
Pt states since about midnight last night pt has had nausea, vomiting, diarrhea, and abd pain

## 2013-07-29 NOTE — ED Notes (Signed)
Pt escorted to discharge window. Pt verbalized understanding discharge instructions. In no acute distress.  

## 2013-07-29 NOTE — ED Notes (Signed)
Pt tolerated PO fuilds

## 2013-08-01 NOTE — ED Provider Notes (Signed)
Medical screening examination/treatment/procedure(s) were performed by non-physician practitioner and as supervising physician I was immediately available for consultation/collaboration.  EKG Interpretation   None         Britny Riel, MD 08/01/13 0854 

## 2013-08-11 ENCOUNTER — Ambulatory Visit: Payer: Federal, State, Local not specified - PPO | Admitting: Internal Medicine

## 2013-08-11 DIAGNOSIS — Z0289 Encounter for other administrative examinations: Secondary | ICD-10-CM

## 2013-12-03 ENCOUNTER — Ambulatory Visit: Payer: Federal, State, Local not specified - PPO | Admitting: Physician Assistant

## 2013-12-03 ENCOUNTER — Ambulatory Visit (INDEPENDENT_AMBULATORY_CARE_PROVIDER_SITE_OTHER): Payer: Federal, State, Local not specified - PPO | Admitting: Physician Assistant

## 2013-12-03 VITALS — BP 132/84 | HR 85 | Temp 98.2°F | Resp 17 | Ht 72.0 in | Wt 282.0 lb

## 2013-12-03 DIAGNOSIS — R059 Cough, unspecified: Secondary | ICD-10-CM

## 2013-12-03 DIAGNOSIS — J029 Acute pharyngitis, unspecified: Secondary | ICD-10-CM

## 2013-12-03 DIAGNOSIS — Z0289 Encounter for other administrative examinations: Secondary | ICD-10-CM

## 2013-12-03 DIAGNOSIS — R05 Cough: Secondary | ICD-10-CM

## 2013-12-03 DIAGNOSIS — J069 Acute upper respiratory infection, unspecified: Secondary | ICD-10-CM

## 2013-12-03 LAB — POCT CBC
GRANULOCYTE PERCENT: 65.2 % (ref 37–80)
HEMATOCRIT: 41.9 % — AB (ref 43.5–53.7)
HEMOGLOBIN: 12.9 g/dL — AB (ref 14.1–18.1)
Lymph, poc: 1.4 (ref 0.6–3.4)
MCH: 26.4 pg — AB (ref 27–31.2)
MCHC: 30.8 g/dL — AB (ref 31.8–35.4)
MCV: 85.8 fL (ref 80–97)
MID (CBC): 0.7 (ref 0–0.9)
MPV: 11.4 fL (ref 0–99.8)
PLATELET COUNT, POC: 188 10*3/uL (ref 142–424)
POC Granulocyte: 4 (ref 2–6.9)
POC LYMPH PERCENT: 23.5 %L (ref 10–50)
POC MID %: 11.3 %M (ref 0–12)
RBC: 4.88 M/uL (ref 4.69–6.13)
RDW, POC: 14.4 %
WBC: 6.1 10*3/uL (ref 4.6–10.2)

## 2013-12-03 LAB — POCT INFLUENZA A/B
INFLUENZA A, POC: NEGATIVE
INFLUENZA B, POC: NEGATIVE

## 2013-12-03 LAB — POCT RAPID STREP A (OFFICE): Rapid Strep A Screen: NEGATIVE

## 2013-12-03 MED ORDER — AZITHROMYCIN 250 MG PO TABS: ORAL_TABLET | ORAL | Status: DC

## 2013-12-03 MED ORDER — HYDROCOD POLST-CHLORPHEN POLST 10-8 MG/5ML PO LQCR: 5.0000 mL | Freq: Two times a day (BID) | ORAL | Status: DC | PRN

## 2013-12-03 MED ORDER — IPRATROPIUM BROMIDE 0.06 % NA SOLN: 2.0000 | Freq: Three times a day (TID) | NASAL | Status: DC

## 2013-12-03 NOTE — Progress Notes (Signed)
Subjective:    Patient ID: Nicholas Shaw, male    DOB: [birth year], 23 y.o.   MRN: [id number]  HPI Primary Physician: Willow OraJose Paz, MD  Chief Complaint: URI x 2-3 days  HPI: 23 y.o. male with history below presents with 2-3 day history of nasal congestion, rhinorrhea, post nasal drip, muffled hearing, sinus pressure, headache, subjective fever, chills, fatigue, and myalgias. Cough is productive of yellowish-green sputum. Some SOB and wheezing. No sore throat prior to the development of the cough. Headache is located along the bilateral temples. He did not get a flu vaccine this year.   Patient was doing some working out prior to the above. He was lifting some weights, biceps, chest, back, and legs. He also sat in the sauna and ran for about 4 miles, this took him 20 minutes he reports. Urine has been looking clear.   He does not know of any known sick contacts. He has tried Catering managerAlka Seltzer powder and pills. Has also tried Tylenol. He last took medication around 10 AM today.   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    History reviewed. No pertinent past medical history.   Home Meds: Prior to Admission medications   Not on File    Allergies: No Known Allergies  History   Social History  . Marital Status: Single    Spouse Name: N/A    Number of Children: N/A  . Years of Education: N/A   Occupational History  . Not on file.   Social History Main Topics  . Smoking status: Never Smoker   . Smokeless tobacco: Not on file  . Alcohol Use: Yes     Comment: occ  . Drug Use: No  . Sexual Activity: No   Other Topics Concern  . Not on file   Social History Narrative  . No narrative on file     Review of Systems  Constitutional: Positive for fever, chills and fatigue. Negative for appetite change.  HENT: Positive for congestion, hearing loss, postnasal drip, rhinorrhea, sinus pressure, sneezing and sore throat. Negative for ear pain.        Nasal  congestion is yellowish-green.   Eyes: Negative for photophobia, pain, discharge, redness, itching and visual disturbance.  Respiratory: Positive for cough, shortness of breath and wheezing.        Cough is secondary to post nasal drip. Cough is causing a sore throat. No ST prior to cough.   Gastrointestinal: Positive for diarrhea. Negative for nausea and vomiting.  Musculoskeletal: Positive for myalgias.  Neurological: Positive for headaches.       Headache is located along the bilateral temples.        Objective:   Physical Exam  Physical Exam: Blood pressure 132/84, pulse 85, temperature 98.2 F (36.8 C), temperature source Oral, resp. rate 17, height 6' (1.829 m), weight 282 lb (127.914 kg), SpO2 98.00%., Body mass index is 38.24 kg/(m^2). General: Well developed, well nourished, in no acute distress. Head: Normocephalic, atraumatic, eyes without discharge, sclera non-icteric, nares are congested. Bilateral auditory canals clear, TM's are without perforation, pearly grey and translucent with reflective cone of light bilaterally. Oral cavity moist, posterior pharynx with post nasal drip. No exudate, erythema, or peritonsillar abscess. Uvula midline.  Neck: Supple. No thyromegaly. Full ROM. Lymph nodes: less than 2 cm AC bilaterally. No nuchal rigidity.  Lungs: Clear bilaterally to auscultation without wheezes, rales, or rhonchi. Breathing is unlabored. Heart: RRR with S1 S2. No murmurs, rubs, or gallops appreciated.  Msk:  Strength and tone normal for age. Extremities/Skin: Warm and dry. No clubbing or cyanosis. No edema. No rashes or suspicious lesions. Neuro: Alert and oriented X 3. Moves all extremities spontaneously. Gait is normal. CNII-XII grossly in tact. Negative Brudzinski's.  Psych:  Responds to questions appropriately with a normal affect.    Labs: Results for orders placed in visit on 12/03/13  POCT CBC      Result Value Ref Range   WBC 6.1  4.6 - 10.2 K/uL   Lymph, poc  1.4  0.6 - 3.4   POC LYMPH PERCENT 23.5  10 - 50 %L   MID (cbc) 0.7  0 - 0.9   POC MID % 11.3  0 - 12 %M   POC Granulocyte 4.0  2 - 6.9   Granulocyte percent 65.2  37 - 80 %G   RBC 4.88  4.69 - 6.13 M/uL   Hemoglobin 12.9 (*) 14.1 - 18.1 g/dL   HCT, POC 11.941.9 (*) 14.743.5 - 53.7 %   MCV 85.8  80 - 97 fL   MCH, POC 26.4 (*) 27 - 31.2 pg   MCHC 30.8 (*) 31.8 - 35.4 g/dL   RDW, POC 82.914.4     Platelet Count, POC 188  142 - 424 K/uL   MPV 11.4  0 - 99.8 fL  POCT RAPID STREP A (OFFICE)      Result Value Ref Range   Rapid Strep A Screen Negative  Negative  POCT INFLUENZA A/B      Result Value Ref Range   Influenza A, POC Negative     Influenza B, POC Negative      Throat culture pending    Assessment & Plan:  23 year old male with URI, cough, headache, and fatigue -Azithromycin 250 MG #6 2 po first day then 1 po next 4 days no RF -Atrovent NS 0.06% 2 sprays each nare bid prn #1 no RF -Tussionex 1 tsp po q 12 hours prn cough #90 mL no RF, SED -Work note given for missed day of 12/02/13 -Rest/fluids -RTC precautions   Eula Listenyan Seraphine Gudiel, MHS, PA-C [facility] [address] [place], [place] [address] [phone] [facility] 12/03/2013 6:05 PM

## 2013-12-05 ENCOUNTER — Telehealth: Payer: Self-pay

## 2013-12-05 NOTE — Telephone Encounter (Signed)
PT CALLED IN WANTING TO GET A NOTE FROM THE DOCTOR TO CLEAR HIM TO GO BACK TO WORK ON Monday 12/06/13.   PLEASE CALL HIM BACK AT [phone]

## 2013-12-05 NOTE — Telephone Encounter (Signed)
Spoke with pt and informed him if he still isn't feeling good enough to go back to work then he needs to return to clinic.

## 2013-12-06 LAB — CULTURE, GROUP A STREP: ORGANISM ID, BACTERIA: NORMAL

## 2013-12-07 ENCOUNTER — Ambulatory Visit (INDEPENDENT_AMBULATORY_CARE_PROVIDER_SITE_OTHER): Payer: Federal, State, Local not specified - PPO | Admitting: Family Medicine

## 2013-12-07 VITALS — BP 142/82 | HR 74 | Temp 98.7°F | Resp 16 | Ht 71.0 in | Wt 281.0 lb

## 2013-12-07 DIAGNOSIS — S39012A Strain of muscle, fascia and tendon of lower back, initial encounter: Secondary | ICD-10-CM

## 2013-12-07 DIAGNOSIS — S335XXA Sprain of ligaments of lumbar spine, initial encounter: Secondary | ICD-10-CM

## 2013-12-07 DIAGNOSIS — M549 Dorsalgia, unspecified: Secondary | ICD-10-CM

## 2013-12-07 NOTE — Progress Notes (Signed)
[facility] [address], [place] [address] [phone]  Date:  12/07/2013   Name:  Nicholas Shaw   DOB:  [birth year]   MRN:  [id number]  PCP:  Willow OraJose Paz, MD    Chief Complaint: Follow-up   History of Present Illness:  Nicholas Shaw is a 23 y.o. very pleasant male patient who presents with the following:  Here today to follow-up. He was here on 6/12 with cough, headache and fatigue. He was started on azithromycin, atrovent nasal and tussionex.   He reports that yesterday at work (he works at [employer] an does some heavy lifting).  After about 90 minutes he had back pain and had to go home.  He did not notice any unusual activity at work.  He lifted a box and had sudden onset of lower back pain.   No radiation down his legs.  No numbness or weakness of his legs, no trouble with bowel or bladder control.  His cough is better, he has not noted a fever.  He does not feel flu- like.  He seems to be over the illness, but needs a note for having to leave work early last night  There are no active problems to display for this patient.   No past medical history on file.  Past Surgical History  Procedure Laterality Date  . Negative      History  Substance Use Topics  . Smoking status: Never Smoker   . Smokeless tobacco: Not on file  . Alcohol Use: Yes     Comment: occ    Family History  Problem Relation Age of Onset  . Hypertension Other     No Known Allergies  Medication list has been reviewed and updated.  Current Outpatient Prescriptions on File Prior to Visit  Medication Sig Dispense Refill  . ipratropium (ATROVENT) 0.06 % nasal spray Place 2 sprays into the nose 3 (three) times daily.  15 mL  0   No current facility-administered medications on file prior to visit.    Review of Systems:  As per HPI- otherwise negative.   Physical Examination: Filed Vitals:   12/07/13 1457  BP: 142/82  Pulse: 74  Temp: 98.7 F (37.1 C)  Resp: 16    Filed Vitals:   12/07/13 1457  Height: 5\' 11"  (1.803 m)  Weight: 281 lb (127.461 kg)   Body mass index is 39.21 kg/(m^2). Ideal Body Weight: Weight in (lb) to have BMI = 25: 178.9  GEN: WDWN, NAD, Non-toxic, A & O x 3, large build, looks well HEENT: Atraumatic, Normocephalic. Neck supple. No masses, No LAD.  Bilateral TM wnl, oropharynx normal.  PEERL,EOMI.   Ears and Nose: No external deformity. CV: RRR, No M/G/R. No JVD. No thrill. No extra heart sounds. PULM: CTA B, no wheezes, crackles, rhonchi. No retractions. No resp. distress. No accessory muscle use. EXTR: No c/c/e NEURO Normal gait.  PSYCH: Normally interactive. Conversant. Not depressed or anxious appearing.  Calm demeanor.  He is tender in the right sided lumbar muscles.  Normal flexion and extension.  Normal LE strength, sensation, DTR and SLR bilaterally, no bony tenderness in his back  Assessment and Plan: Lumbar strain  Back pain  Lumbar strain- had to leave work last night.  He is off work today and tomorrow, and feels he can do full duty at his next shift.   See patient instructions for more details.     Signed Abbe AmsterdamJessica Copland, MD

## 2013-12-07 NOTE — Patient Instructions (Signed)
Take it easy the next couple of days but don't just sit on the couch- movement will help to prevent stiffness.  Let me know if you do not feel better in then next few days.   It is ok to use ibuprofen for pain.

## 2014-01-21 ENCOUNTER — Ambulatory Visit: Payer: Federal, State, Local not specified - PPO | Admitting: Physician Assistant

## 2014-01-21 DIAGNOSIS — Z0289 Encounter for other administrative examinations: Secondary | ICD-10-CM

## 2014-03-01 ENCOUNTER — Ambulatory Visit (INDEPENDENT_AMBULATORY_CARE_PROVIDER_SITE_OTHER): Payer: Federal, State, Local not specified - PPO | Admitting: Physician Assistant

## 2014-03-01 ENCOUNTER — Encounter: Payer: Self-pay | Admitting: Physician Assistant

## 2014-03-01 VITALS — BP 165/85 | HR 62 | Temp 97.8°F | Wt 189.0 lb

## 2014-03-01 DIAGNOSIS — R030 Elevated blood-pressure reading, without diagnosis of hypertension: Secondary | ICD-10-CM

## 2014-03-01 DIAGNOSIS — L280 Lichen simplex chronicus: Secondary | ICD-10-CM | POA: Insufficient documentation

## 2014-03-01 DIAGNOSIS — IMO0001 Reserved for inherently not codable concepts without codable children: Secondary | ICD-10-CM | POA: Insufficient documentation

## 2014-03-01 DIAGNOSIS — L989 Disorder of the skin and subcutaneous tissue, unspecified: Secondary | ICD-10-CM

## 2014-03-01 MED ORDER — CLOBETASOL PROPIONATE 0.05 % EX CREA: 1.0000 "application " | TOPICAL_CREAM | Freq: Two times a day (BID) | CUTANEOUS | Status: DC

## 2014-03-01 NOTE — Patient Instructions (Addendum)
Apply prescription cream twice daily over the next 2 weeks.  Apply the first daily application after your morning shower. Take a Benadryl at bedtime for severe itch. Can also take a morning Clartin.  Apply cool compresses to the area.  Topical Sarna lotion may also be beneficial.I Call me if symptoms are not improving.  We may have to set you up with a dermatologist.

## 2014-03-01 NOTE — Assessment & Plan Note (Signed)
Clobetasol BID x 2 weeks.  Then can use intermittently QD in 2 week intervals.  Moisturize daily.  Avoid scratching as this will worsen condition.  Daily claritin. Sarna lotion may be of benefit.

## 2014-03-01 NOTE — Assessment & Plan Note (Signed)
Asymptomatic.  Possibly due to anxiety over pruritic rash.  Encouraged DASH diet. Follow-up with PCP as scheduled.

## 2014-03-01 NOTE — Assessment & Plan Note (Signed)
Unspecified dermatitis.  Atopic most likely.  Rx Clobetasol to use BID. Claritin in AM.  Can add Benadryl at bedtime for significant itch. Sarna lotion.  Moisturize daily.  Cool compresses.

## 2014-03-01 NOTE — Progress Notes (Signed)
Pre visit review using our clinic review tool, if applicable. No additional management support is needed unless otherwise documented below in the visit note. 

## 2014-03-01 NOTE — Progress Notes (Signed)
Patient presents to clinic today c/o pruritic and dry, scaly skin of the flexor surfaces of his arms bilaterally.  Patient endorses history of eczema of forearms but never to this extent.  Denies fever, chills, myalgias.  Denies drainage from rash.  Denies change to hygiene products, lotions or detergents.  Denies new pets.  Denies friend/family member/coworker with similar symptoms.  No past medical history on file.  Current Outpatient Prescriptions on File Prior to Visit  Medication Sig Dispense Refill  . ipratropium (ATROVENT) 0.06 % nasal spray Place 2 sprays into the nose 3 (three) times daily.  15 mL  0   No current facility-administered medications on file prior to visit.    No Known Allergies  Family History  Problem Relation Age of Onset  . Hypertension Other     History   Social History  . Marital Status: Single    Spouse Name: N/A    Number of Children: N/A  . Years of Education: N/A   Social History Main Topics  . Smoking status: Never Smoker   . Smokeless tobacco: None  . Alcohol Use: Yes     Comment: occ  . Drug Use: No  . Sexual Activity: No   Other Topics Concern  . None   Social History Narrative  . None   Review of Systems - See HPI.  All other ROS are negative.  BP 165/85  Pulse 62  Temp(Src) 97.8 F (36.6 C)  Wt 189 lb (85.73 kg)  SpO2 98%  Physical Exam  Vitals reviewed. Constitutional: He is well-developed, well-nourished, and in no distress.  HENT:  Head: Normocephalic and atraumatic.  Eyes: Conjunctivae are normal. Pupils are equal, round, and reactive to light.  Neck: Neck supple.  Cardiovascular: Normal rate, regular rhythm, normal heart sounds and intact distal pulses.   Pulmonary/Chest: Effort normal and breath sounds normal. No respiratory distress. He has no wheezes. He has no rales. He exhibits no tenderness.  Skin: Skin is warm and dry.      Recent Results (from the past 2160 hour(s))  CULTURE, GROUP A STREP     Status:  None   Collection Time    12/03/13  6:13 PM      Result Value Ref Range   Organism ID, Bacteria Normal Upper Respiratory Flora     Organism ID, Bacteria No Beta Hemolytic Streptococci Isolated    POCT CBC     Status: Abnormal   Collection Time    12/03/13  6:15 PM      Result Value Ref Range   WBC 6.1  4.6 - 10.2 K/uL   Lymph, poc 1.4  0.6 - 3.4   POC LYMPH PERCENT 23.5  10 - 50 %L   MID (cbc) 0.7  0 - 0.9   POC MID % 11.3  0 - 12 %M   POC Granulocyte 4.0  2 - 6.9   Granulocyte percent 65.2  37 - 80 %G   RBC 4.88  4.69 - 6.13 M/uL   Hemoglobin 12.9 (*) 14.1 - 18.1 g/dL   HCT, POC 16.1 (*) 09.6 - 53.7 %   MCV 85.8  80 - 97 fL   MCH, POC 26.4 (*) 27 - 31.2 pg   MCHC 30.8 (*) 31.8 - 35.4 g/dL   RDW, POC 04.5     Platelet Count, POC 188  142 - 424 K/uL   MPV 11.4  0 - 99.8 fL  POCT RAPID STREP A (OFFICE)     Status:  None   Collection Time    12/03/13  6:16 PM      Result Value Ref Range   Rapid Strep A Screen Negative  Negative  POCT INFLUENZA A/B     Status: None   Collection Time    12/03/13  6:24 PM      Result Value Ref Range   Influenza A, POC Negative     Influenza B, POC Negative      Assessment/Plan: Lichen simplex chronicus Clobetasol BID x 2 weeks.  Then can use intermittently QD in 2 week intervals.  Moisturize daily.  Avoid scratching as this will worsen condition.  Daily claritin. Sarna lotion may be of benefit.  Unspecified disorder of skin and subcutaneous tissue Unspecified dermatitis.  Atopic most likely.  Rx Clobetasol to use BID. Claritin in AM.  Can add Benadryl at bedtime for significant itch. Sarna lotion.  Moisturize daily.  Cool compresses.  Elevated BP Asymptomatic.  Possibly due to anxiety over pruritic rash.  Encouraged DASH diet. Follow-up with PCP as scheduled.

## 2014-03-17 ENCOUNTER — Telehealth: Payer: Self-pay | Admitting: Internal Medicine

## 2014-03-17 DIAGNOSIS — L989 Disorder of the skin and subcutaneous tissue, unspecified: Secondary | ICD-10-CM

## 2014-03-17 MED ORDER — CLOBETASOL PROPIONATE 0.05 % EX CREA: 1.0000 "application " | TOPICAL_CREAM | Freq: Two times a day (BID) | CUTANEOUS | Status: DC

## 2014-03-17 NOTE — Telephone Encounter (Signed)
Caller name:Nicholas Shaw Relation to ON:GEXB Call back number:[phone] Pharmacy:[facility]  Reason for call: pt states he forgot to pick up his rx clobetasol cream (TEMOVATE) 0.05 %, states he contacted the pharmacy today and was informed that they never received the rx.

## 2014-03-17 NOTE — Telephone Encounter (Signed)
MEdication has been resent.  They probably filled the medication on 03/01/14 when it was prescribed and took it off the shelf after 72 hours if the patient did not go pick it up.

## 2014-03-19 ENCOUNTER — Ambulatory Visit (INDEPENDENT_AMBULATORY_CARE_PROVIDER_SITE_OTHER): Payer: Federal, State, Local not specified - PPO | Admitting: Family Medicine

## 2014-03-19 VITALS — BP 138/82 | HR 75 | Temp 97.7°F | Resp 18 | Ht 72.0 in | Wt 286.2 lb

## 2014-03-19 DIAGNOSIS — M545 Low back pain, unspecified: Secondary | ICD-10-CM

## 2014-03-19 DIAGNOSIS — T148XXA Other injury of unspecified body region, initial encounter: Secondary | ICD-10-CM

## 2014-03-19 MED ORDER — IBUPROFEN 600 MG PO TABS: 600.0000 mg | ORAL_TABLET | Freq: Three times a day (TID) | ORAL | Status: DC | PRN

## 2014-03-19 NOTE — Patient Instructions (Signed)
Back Pain, Adult °Back pain is very common. The pain often gets better over time. The cause of back pain is usually not dangerous. Most people can learn to manage their back pain on their own.  °HOME CARE  °· Stay active. Start with short walks on flat ground if you can. Try to walk farther each day. °· Do not sit, drive, or stand in one place for more than 30 minutes. Do not stay in bed. °· Do not avoid exercise or work. Activity can help your back heal faster. °· Be careful when you bend or lift an object. Bend at your knees, keep the object close to you, and do not twist. °· Sleep on a firm mattress. Lie on your side, and bend your knees. If you lie on your back, put a pillow under your knees. °· Only take medicines as told by your doctor. °· Put ice on the injured area. °¨ Put ice in a plastic bag. °¨ Place a towel between your skin and the bag. °¨ Leave the ice on for 15-20 minutes, 03-04 times a day for the first 2 to 3 days. After that, you can switch between ice and heat packs. °· Ask your doctor about back exercises or massage. °· Avoid feeling anxious or stressed. Find good ways to deal with stress, such as exercise. °GET HELP RIGHT AWAY IF:  °· Your pain does not go away with rest or medicine. °· Your pain does not go away in 1 week. °· You have new problems. °· You do not feel well. °· The pain spreads into your legs. °· You cannot control when you poop (bowel movement) or pee (urinate). °· Your arms or legs feel weak or lose feeling (numbness). °· You feel sick to your stomach (nauseous) or throw up (vomit). °· You have belly (abdominal) pain. °· You feel like you may pass out (faint). °MAKE SURE YOU:  °· Understand these instructions. °· Will watch your condition. °· Will get help right away if you are not doing well or get worse. °Document Released: 11/27/2007 Document Revised: 09/02/2011 Document Reviewed: 10/12/2013 °ExitCare® Patient Information ©2015 [employer]. This information is not intended  to replace advice given to you by your health care provider. Make sure you discuss any questions you have with your health care provider. ° °

## 2014-03-19 NOTE — Progress Notes (Signed)
 Chief Complaint:  Chief Complaint  Patient presents with  . Back Pain    x 2 days     HPI: Nicholas Shaw is a 23 y.o. male who is here for  Low back pain , midline after lifting at [employer] where he works. NKI prior to this, has not tried anything. Needs work note. Throbbing 4/10 pain. Denies incontinence.   History reviewed. No pertinent past medical history. Past Surgical History  Procedure Laterality Date  . Negative     History   Social History  . Marital Status: Single    Spouse Name: N/A    Number of Children: N/A  . Years of Education: N/A   Social History Main Topics  . Smoking status: Never Smoker   . Smokeless tobacco: None  . Alcohol Use: Yes     Comment: occ  . Drug Use: No  . Sexual Activity: No   Other Topics Concern  . None   Social History Narrative  . None   Family History  Problem Relation Age of Onset  . Hypertension Other    No Known Allergies Prior to Admission medications   Not on File     ROS: The patient denies fevers, chills, night sweats, unintentional weight loss, chest pain, palpitations, wheezing, dyspnea on exertion, nausea, vomiting, abdominal pain, dysuria, hematuria, melena, numbness, weakness, or tingling.   All other systems have been reviewed and were otherwise negative with the exception of those mentioned in the HPI and as above.    PHYSICAL EXAM: Filed Vitals:   03/19/14 1120  BP: 170/90  Pulse: 75  Temp: 97.7 F (36.5 C)  Resp: 18   Filed Vitals:   03/19/14 1120  Height: 6' (1.829 m)  Weight: 286 lb 3.2 oz (129.819 kg)   Body mass index is 38.81 kg/(m^2).  General: Alert, no acute distress HEENT:  Normocephalic, atraumatic, oropharynx patent. EOMI, PERRLA Cardiovascular:  Regular rate and rhythm, no rubs murmurs or gallops.  No Carotid bruits, radial pulse intact. No pedal edema.  Respiratory: Clear to auscultation bilaterally.  No wheezes, rales, or rhonchi.  No cyanosis, no use of accessory  musculature GI: No organomegaly, abdomen is soft and non-tender, positive bowel sounds.  No masses. Skin: No rashes. Neurologic: Facial musculature symmetric. Psychiatric: Patient is appropriate throughout our interaction. Lymphatic: No cervical lymphadenopathy Musculoskeletal: Gait intact. + paramsk tenderness  Decrease flexion ROM 5/5 strength, 2/2 DTRs No saddle anesthesia Straight leg negative Hip and knee exam--normal    LABS: Results for orders placed in visit on 12/03/13  CULTURE, GROUP A STREP      Result Value Ref Range   Organism ID, Bacteria Normal Upper Respiratory Flora     Organism ID, Bacteria No Beta Hemolytic Streptococci Isolated    POCT CBC      Result Value Ref Range   WBC 6.1  4.6 - 10.2 K/uL   Lymph, poc 1.4  0.6 - 3.4   POC LYMPH PERCENT 23.5  10 - 50 %L   MID (cbc) 0.7  0 - 0.9   POC MID % 11.3  0 - 12 %M   POC Granulocyte 4.0  2 - 6.9   Granulocyte percent 65.2  37 - 80 %G   RBC 4.88  4.69 - 6.13 M/uL   Hemoglobin 12.9 (*) 14.1 - 18.1 g/dL   HCT, POC 16.1 (*) 09.6 - 53.7 %   MCV 85.8  80 - 97 fL   MCH, POC 26.4 (*)  27 - 31.2 pg   MCHC 30.8 (*) 31.8 - 35.4 g/dL   RDW, POC 16.1     Platelet Count, POC 188  142 - 424 K/uL   MPV 11.4  0 - 99.8 fL  POCT RAPID STREP A (OFFICE)      Result Value Ref Range   Rapid Strep A Screen Negative  Negative  POCT INFLUENZA A/B      Result Value Ref Range   Influenza A, POC Negative     Influenza B, POC Negative       EKG/XRAY:   Primary read interpreted by Dr. Conley Rolls at [facility].   ASSESSMENT/PLAN: Encounter Diagnoses  Name Primary?  . Midline low back pain without sciatica Yes  . Sprain and strain    BP  recheck 130/80 Rx  Ibuprofen Work note given   Gross sideeffects, risk and benefits, and alternatives of medications d/w patient. Patient is aware that all medications have potential sideeffects and we are unable to predict every sideeffect or drug-drug interaction that may occur.  ,  PHUONG,  DO 03/19/2014 11:52 AM

## 2014-03-31 ENCOUNTER — Ambulatory Visit (INDEPENDENT_AMBULATORY_CARE_PROVIDER_SITE_OTHER): Payer: Federal, State, Local not specified - PPO | Admitting: Internal Medicine

## 2014-03-31 ENCOUNTER — Ambulatory Visit (INDEPENDENT_AMBULATORY_CARE_PROVIDER_SITE_OTHER)
Admission: RE | Admit: 2014-03-31 | Discharge: 2014-03-31 | Disposition: A | Discharge status: Home / Self Care | Payer: Federal, State, Local not specified - PPO | Source: Ambulatory Visit | Attending: Internal Medicine | Admitting: Internal Medicine

## 2014-03-31 ENCOUNTER — Encounter: Payer: Self-pay | Admitting: Internal Medicine

## 2014-03-31 VITALS — BP 130/62 | HR 69 | Temp 98.4°F | Resp 13 | Wt 292.0 lb

## 2014-03-31 DIAGNOSIS — M545 Low back pain, unspecified: Secondary | ICD-10-CM

## 2014-03-31 MED ORDER — TRAMADOL HCL 50 MG PO TABS: 50.0000 mg | ORAL_TABLET | Freq: Three times a day (TID) | ORAL | Status: DC | PRN

## 2014-03-31 NOTE — Progress Notes (Signed)
   Subjective:    Patient ID: Nicholas Shaw, male    DOB: [birth year], 23 y.o.   MRN: [id number]  HPI  On 10/6 he was bending to lift a box when he experienced acute lumbosacral area pain. He did not actually lift the box. He applied ice, heat, and rested. As of 10/7 the pain was essentially gone but returned today.  He describes it as sharp pain up to level IV lasting seconds in the mid right low back. It does not radiate into the lower extremities.  He was seen at the urgent care 03/19/14 for acute low back pain in the midline after lifting at work. He was given ibuprofen 600 mg to be taken every 8 hours as needed.  He does lift 25-50 # boxes & place them on a palate repeatedly @ work. This appears to involve repeated twisting motion of thorax based on his description.Marland Kitchen.   He denies any history of injury or surgery on his back.   He has also  had some headache today which he describes as 4 on 10 scale &  throbbing.     Review of Systems    He does not have radiation of pain into the lower extremities. He has no associated limb weakness, numbness, tingling.  He has had no gait or balance issues  He's had no loss of control of bladder or bowels.  There's been no rash or change in color or temperature of skin in the area discomfort  He has no constitutional symptoms of fever, chills, sweats, weight loss.      Objective:   Physical Exam   Gait including heel and toe walking is normal.  Deep tendon reflexes are 0-one half at the knees.  He is able to lie flat and sit up without help.  Straight leg raising is negative  Strength and tone in lower extremities is excellent.  General appearance :adequately nourished; in no distress.  Eyes: No conjunctival inflammation or scleral icterus is present.  Heart:  Normal rate and regular rhythm. S1 and S2 normal without gallop, murmur, click, rub or other extra sounds     Lungs:Chest clear to auscultation; no wheezes,  rhonchi,rales ,or rubs present.No increased work of breathing.   Abdomen: bowel sounds normal, soft and non-tender without masses, organomegaly or hernias noted.  No guarding or rebound. No flank tenderness to percussion.  Vascular : all pulses equal ; no bruits present.  Skin:Warm & dry.  Intact without suspicious lesions or rashes ; no  tenting  Lymphatic: No lymphadenopathy is noted about the head, neck, axilla            Assessment & Plan:  #1 Acute recurrent low back pain. This is his second episode in 2 weeks. Films will be performed to rule out any congenital anomaly. See AVS Back hygiene & good work ergonomics  needed to prevent permanent LB issues.

## 2014-03-31 NOTE — Patient Instructions (Addendum)
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  The best exercises for the low back include freestyle swimming, stretch aerobics, and yoga.Cybex & Nautilus machines rather than dead weights are better for the back.  If symptoms persist ,I recommend you see Dr Terrilee FilesZach Smith, Sports Medicine specialist., phone # [phone].

## 2014-03-31 NOTE — Progress Notes (Signed)
   Subjective:    Patient ID: Nicholas Shaw, male    DOB: 10/05/1990, 23 y.o.   MRN: [id number]  HPI He is seeking care today for a repeat back injury that occurred on 10/6.  Initial injury occurred approximately 2 weeks ago while bending to lift boxes of about 40 pounds.    On 10/6 he was bending to lift a box when pain started.  Lifting did not occur.  He treated with ice, heat, and rest.  Pain was mostly relieved on 10/7 but has returned today characterized as sharp pain 4/10 lasting seconds in his low back.  This pain is non-radiating.  There is no noted precipitating event.  Today he has also experienced a headache with constant 4/10 throbbing pain.  He does not usually have headaches.   Review of Systems    Denies fever, chills, sweats, blurred vision, passing out, chest pain, heart racing, and shortness of breath.  He also denies weakness, numbness, tingling in arms and legs.  No reported difficulty with balance or walking. No loss of control of bladder or bowels.  Objective:   Physical Exam        Assessment & Plan:

## 2014-03-31 NOTE — Progress Notes (Signed)
Pre visit review using our clinic review tool, if applicable. No additional management support is needed unless otherwise documented below in the visit note. 

## 2014-04-04 ENCOUNTER — Emergency Department (HOSPITAL_BASED_OUTPATIENT_CLINIC_OR_DEPARTMENT_OTHER)
Admission: EM | Admit: 2014-04-04 | Discharge: 2014-04-04 | Disposition: A | Discharge status: Home / Self Care | Payer: Federal, State, Local not specified - PPO | Attending: Emergency Medicine | Admitting: Emergency Medicine

## 2014-04-04 ENCOUNTER — Encounter (HOSPITAL_BASED_OUTPATIENT_CLINIC_OR_DEPARTMENT_OTHER): Payer: Self-pay | Admitting: Emergency Medicine

## 2014-04-04 DIAGNOSIS — R0789 Other chest pain: Secondary | ICD-10-CM | POA: Insufficient documentation

## 2014-04-04 DIAGNOSIS — M545 Low back pain, unspecified: Secondary | ICD-10-CM

## 2014-04-04 DIAGNOSIS — R079 Chest pain, unspecified: Secondary | ICD-10-CM | POA: Diagnosis present

## 2014-04-04 MED ORDER — NAPROXEN 250 MG PO TABS
500.0000 mg | ORAL_TABLET | Freq: Two times a day (BID) | ORAL | Status: DC
  Administered 2014-04-04: 500 mg via ORAL
  Filled 2014-04-04: qty 2

## 2014-04-04 MED ORDER — NAPROXEN 500 MG PO TABS: 500.0000 mg | ORAL_TABLET | Freq: Two times a day (BID) | ORAL | Status: DC

## 2014-04-04 NOTE — ED Notes (Signed)
MD with pt  

## 2014-04-04 NOTE — Discharge Instructions (Signed)
Continue taking the tramadol prescribed to you.  Start naprosyn.  Follow up with your doctor this week.   Chest Wall Pain Chest wall pain is pain in or around the bones and muscles of your chest. It may take up to 6 weeks to get better. It may take longer if you must stay physically active in your work and activities.  CAUSES  Chest wall pain may happen on its own. However, it may be caused by:  A viral illness like the flu.  Injury.  Coughing.  Exercise.  Arthritis.  Fibromyalgia.  Shingles. HOME CARE INSTRUCTIONS   Avoid overtiring physical activity. Try not to strain or perform activities that cause pain. This includes any activities using your chest or your abdominal and side muscles, especially if heavy weights are used.  Put ice on the sore area.  Put ice in a plastic bag.  Place a towel between your skin and the bag.  Leave the ice on for 15-20 minutes per hour while awake for the first 2 days.  Only take over-the-counter or prescription medicines for pain, discomfort, or fever as directed by your caregiver. SEEK IMMEDIATE MEDICAL CARE IF:   Your pain increases, or you are very uncomfortable.  You have a fever.  Your chest pain becomes worse.  You have new, unexplained symptoms.  You have nausea or vomiting.  You feel sweaty or lightheaded.  You have a cough with phlegm (sputum), or you cough up blood. MAKE SURE YOU:   Understand these instructions.  Will watch your condition.  Will get help right away if you are not doing well or get worse. Document Released: 06/10/2005 Document Revised: 09/02/2011 Document Reviewed: 02/04/2011 [facility] Patient Information 2015 [place], [place]. This information is not intended to replace advice given to you by your health care provider. Make sure you discuss any questions you have with your health care provider.  Costochondritis Costochondritis, sometimes called Tietze syndrome, is a swelling and irritation  (inflammation) of the tissue (cartilage) that connects your ribs with your breastbone (sternum). It causes pain in the chest and rib area. Costochondritis usually goes away on its own over time. It can take up to 6 weeks or longer to get better, especially if you are unable to limit your activities. CAUSES  Some cases of costochondritis have no known cause. Possible causes include:  Injury (trauma).  Exercise or activity such as lifting.  Severe coughing. SIGNS AND SYMPTOMS  Pain and tenderness in the chest and rib area.  Pain that gets worse when coughing or taking deep breaths.  Pain that gets worse with specific movements. DIAGNOSIS  Your health care provider will do a physical exam and ask about your symptoms. Chest X-rays or other tests may be done to rule out other problems. TREATMENT  Costochondritis usually goes away on its own over time. Your health care provider may prescribe medicine to help relieve pain. HOME CARE INSTRUCTIONS   Avoid exhausting physical activity. Try not to strain your ribs during normal activity. This would include any activities using chest, abdominal, and side muscles, especially if heavy weights are used.  Apply ice to the affected area for the first 2 days after the pain begins.  Put ice in a plastic bag.  Place a towel between your skin and the bag.  Leave the ice on for 20 minutes, 2-3 times a day.  Only take over-the-counter or prescription medicines as directed by your health care provider. SEEK MEDICAL CARE IF:  You have redness  or swelling at the rib joints. These are signs of infection.  Your pain does not go away despite rest or medicine. SEEK IMMEDIATE MEDICAL CARE IF:   Your pain increases or you are very uncomfortable.  You have shortness of breath or difficulty breathing.  You cough up blood.  You have worse chest pains, sweating, or vomiting.  You have a fever or persistent symptoms for more than 2-3 days.  You have a  fever and your symptoms suddenly get worse. MAKE SURE YOU:   Understand these instructions.  Will watch your condition.  Will get help right away if you are not doing well or get worse. Document Released: 03/20/2005 Document Revised: 03/31/2013 Document Reviewed: 01/12/2013 [facility]Exit[facility] Patient Information 2015 [place], [place]. This information is not intended to replace advice given to you by your health care provider. Make sure you discuss any questions you have with your health care provider.  Heat Therapy Heat therapy can help ease sore, stiff, injured, and tight muscles and joints. Heat relaxes your muscles, which may help ease your pain.  RISKS AND COMPLICATIONS If you have any of the following conditions, do not use heat therapy unless your health care provider has approved:  Poor circulation.  Healing wounds or scarred skin in the area being treated.  Diabetes, heart disease, or high blood pressure.  Not being able to feel (numbness) the area being treated.  Unusual swelling of the area being treated.  Active infections.  Blood clots.  Cancer.  Inability to communicate pain. This may include young children and people who have problems with their brain function (dementia).  Pregnancy. Heat therapy should only be used on old, pre-existing, or long-lasting (chronic) injuries. Do not use heat therapy on new injuries unless directed by your health care provider. HOW TO USE HEAT THERAPY There are several different kinds of heat therapy, including:  Moist heat pack.  Warm water bath.  Hot water bottle.  Electric heating pad.  Heated gel pack.  Heated wrap.  Electric heating pad. Use the heat therapy method suggested by your health care provider. Follow your health care provider's instructions on when and how to use heat therapy. GENERAL HEAT THERAPY RECOMMENDATIONS  Do not sleep while using heat therapy. Only use heat therapy while you are awake.  Your skin may  turn pink while using heat therapy. Do not use heat therapy if your skin turns red.  Do not use heat therapy if you have new pain.  High heat or long exposure to heat can cause burns. Be careful when using heat therapy to avoid burning your skin.  Do not use heat therapy on areas of your skin that are already irritated, such as with a rash or sunburn. SEEK MEDICAL CARE IF:  You have blisters, redness, swelling, or numbness.  You have new pain.  Your pain is worse. MAKE SURE YOU:  Understand these instructions.  Will watch your condition.  Will get help right away if you are not doing well or get worse. Document Released: 09/02/2011 Document Revised: 10/25/2013 Document Reviewed: 08/03/2013 [facility] CityExitCare Patient Information 2015 [place], [place]LL[place]. This information is not intended to replace advice given to you by your health care provider. Make sure you discuss any questions you have with your health care provider.

## 2014-04-04 NOTE — ED Provider Notes (Signed)
CSN: [id number]     Arrival date & time 04/04/14  0537 History   First MD Initiated Contact with Patient 04/04/14 [phone]     Chief Complaint  Patient presents with  . Chest Pain     (Consider location/radiation/quality/duration/timing/severity/associated sxs/prior Treatment) HPI 23 year old male presents to emergency department with complaint of left-sided anterior chest pain.  Pain started about 3 hours ago while at work.  Patient was lifting boxes off a pallet.  He reports the pain was not bad at first, but worsened throughout his shift.  Patient denies any medical problems, no medical history no medications.  No allergies.  No prior history of same.  He denies any lower extremity swelling, no immobilization or surgery.  He has pain with deep breathing in the area of his left sternum, but denies any sharp anterior pain. History reviewed. No pertinent past medical history. Past Surgical History  Procedure Laterality Date  . Negative     Family History  Problem Relation Age of Onset  . Hypertension Other    History  Substance Use Topics  . Smoking status: Never Smoker   . Smokeless tobacco: Not on file  . Alcohol Use: Yes     Comment: occ    Review of Systems  See History of Present Illness; otherwise all other systems are reviewed and negative   Allergies  Review of patient's allergies indicates no known allergies.  Home Medications   Prior to Admission medications   Medication Sig Start Date End Date Taking? Authorizing Provider  naproxen (NAPROSYN) 500 MG tablet Take 1 tablet (500 mg total) by mouth 2 (two) times daily with a meal. 04/04/14   Olivia Mackielga M Kailan Carmen, MD  traMADol (ULTRAM) 50 MG tablet Take 1 tablet (50 mg total) by mouth every 8 (eight) hours as needed. 03/31/14   Pecola LawlessWilliam F Hopper, MD   There were no vitals taken for this visit. Physical Exam  Nursing note and vitals reviewed. Constitutional: He is oriented to person, place, and time. He appears well-developed and  well-nourished.  Obese male, no acute distress  HENT:  Head: Normocephalic and atraumatic.  Nose: Nose normal.  Mouth/Throat: Oropharynx is clear and moist.  Eyes: Conjunctivae and EOM are normal. Pupils are equal, round, and reactive to light.  Neck: Normal range of motion. Neck supple. No JVD present. No tracheal deviation present. No thyromegaly present.  Cardiovascular: Normal rate, regular rhythm, normal heart sounds and intact distal pulses.  Exam reveals no gallop and no friction rub.   No murmur heard. Pulmonary/Chest: Effort normal and breath sounds normal. No stridor. No respiratory distress. He has no wheezes. He has no rales. He exhibits tenderness (patient has tenderness of his left sternum along the third fourth and fifth ribs/sternal junction no effusion no overlying skin changes).  Abdominal: Soft. Bowel sounds are normal. He exhibits no distension and no mass. There is no tenderness. There is no rebound and no guarding.  Musculoskeletal: Normal range of motion. He exhibits no edema and no tenderness.  Lymphadenopathy:    He has no cervical adenopathy.  Neurological: He is alert and oriented to person, place, and time. He displays normal reflexes. He exhibits normal muscle tone. Coordination normal.  Skin: Skin is warm and dry. No rash noted. No erythema. No pallor.  Psychiatric: He has a normal mood and affect. His behavior is normal. Judgment and thought content normal.    ED Course  Procedures (including critical care time) Labs Review Labs Reviewed - No data to  display  Imaging Review No results found.   EKG Interpretation   Date/Time:  Monday April 04 2014 05:51:41 EDT Ventricular Rate:  77 PR Interval:  202 QRS Duration: 88 QT Interval:  342 QTC Calculation: 387 R Axis:   -39 Text Interpretation:  Normal sinus rhythm Left axis deviation Abnormal ECG  Confirmed by Grayling Schranz  MD, Shanan Fitzpatrick ([id number]) on 04/04/2014 6:04:42 AM      MDM   Final diagnoses:  Chest  wall pain    23 year old male with chest wall pain, possible costochondritis.  Of note, patient was seen on the eighth at his primary care clinic with complaint of low back pain, that time he was given a prescription for Ultram.  He was seen for back pain 2 weeks prior to that.  He is to follow back up with his primary care Dr.  Maryclare LabradorWe'll start him on NSAID and heat therapy.  We'll give him a week of light duty at work.    Olivia Mackielga M Chandra Asher, MD 04/04/14 [phone]

## 2014-04-04 NOTE — ED Notes (Addendum)
C/o ant chest pain that started approx 3 hours ago while at work. Describes as sharp and states pain is worse while lifting boxes at work. States pain comes and goes. Denies any heart hx. Denies any n/v or sob. Denies any other complaints. Denies any recent long car rides or plane trips.

## 2014-04-07 ENCOUNTER — Ambulatory Visit: Payer: Federal, State, Local not specified - PPO | Admitting: Internal Medicine

## 2014-04-07 DIAGNOSIS — Z0289 Encounter for other administrative examinations: Secondary | ICD-10-CM

## 2014-04-11 ENCOUNTER — Ambulatory Visit (INDEPENDENT_AMBULATORY_CARE_PROVIDER_SITE_OTHER): Payer: Federal, State, Local not specified - PPO | Admitting: Internal Medicine

## 2014-04-11 ENCOUNTER — Encounter: Payer: Self-pay | Admitting: Internal Medicine

## 2014-04-11 VITALS — BP 142/82 | HR 67 | Temp 98.0°F | Wt 291.5 lb

## 2014-04-11 DIAGNOSIS — M545 Low back pain, unspecified: Secondary | ICD-10-CM

## 2014-04-11 NOTE — Progress Notes (Signed)
Pre visit review using our clinic review tool, if applicable. No additional management support is needed unless otherwise documented below in the visit note. 

## 2014-04-11 NOTE — Progress Notes (Signed)
   Subjective:    Patient ID: Nicholas Shaw, male    DOB: [birth year], 23 y.o.   MRN: [id number]  DOS:  04/11/2014 Type of visit - description : ER f/u Interval history: Went to the ER a few days ago with a three-hour history of left-sided chest pain that started after he was lifting boxes. Physical exam was consistent with costochondritis, EKG was negative, here for followup   ROS The patient has taken naproxen twice a day up until 2 days ago, it decreased the pain, now the pain is on and off but less intense. Denies nausea, vomiting, diarrhea, abdominal pain or heartburn  History reviewed. No pertinent past medical history.  Past Surgical History  Procedure Laterality Date  . Negative      History   Social History  . Marital Status: Single    Spouse Name: N/A    Number of Children: N/A  . Years of Education: N/A   Occupational History  . Not on file.   Social History Main Topics  . Smoking status: Never Smoker   . Smokeless tobacco: Not on file  . Alcohol Use: Yes     Comment: occ  . Drug Use: No  . Sexual Activity: No   Other Topics Concern  . Not on file   Social History Narrative  . No narrative on file        Medication List       This list is accurate as of: 04/11/14  7:02 PM.  Always use your most recent med list.               naproxen 500 MG tablet  Commonly known as:  NAPROSYN  Take 1 tablet (500 mg total) by mouth 2 (two) times daily with a meal.     traMADol 50 MG tablet  Commonly known as:  ULTRAM  Take 1 tablet (50 mg total) by mouth every 8 (eight) hours as needed.           Objective:   Physical Exam BP 142/82  Pulse 67  Temp(Src) 98 F (36.7 C) (Oral)  Wt 291 lb 8 oz (132.224 kg)  SpO2 98% General -- alert, well-developed, NAD.   Lungs -- normal respiratory effort, no intercostal retractions, no accessory muscle use, and normal breath sounds. Minimal TTP L costochondral area   Heart-- normal rate, regular rhythm, no  murmur.  Abdomen-- Not distended, good bowel sounds,soft, non-tender. Extremities-- no pretibial edema bilaterally  Neurologic--  alert & oriented X3.   Psych-- Cognition and judgment appear intact. Cooperative with normal attention span and concentration. No anxious or depressed appearing.        Assessment & Plan:   Costochondritis, Costochondritis, improved with naproxen, no apparent GI side effects. He has been off work since 04/04/2014 until today, feels ready  to go back tomorrow . Reason for not  Working is  because the ER told him no lifting above 25 pounds x 1 week. Letter printed  Recommend to take naproxen as needed and ice pack if necessary

## 2014-05-27 ENCOUNTER — Ambulatory Visit (INDEPENDENT_AMBULATORY_CARE_PROVIDER_SITE_OTHER): Payer: Federal, State, Local not specified - PPO | Admitting: Emergency Medicine

## 2014-05-27 ENCOUNTER — Ambulatory Visit (HOSPITAL_COMMUNITY)
Admission: RE | Admit: 2014-05-27 | Discharge: 2014-05-27 | Disposition: A | Discharge status: Home / Self Care | Payer: Federal, State, Local not specified - PPO | Source: Ambulatory Visit | Attending: Emergency Medicine | Admitting: Emergency Medicine

## 2014-05-27 VITALS — BP 132/82 | HR 64 | Temp 98.7°F | Resp 17 | Ht 72.0 in | Wt 286.0 lb

## 2014-05-27 DIAGNOSIS — R51 Headache: Secondary | ICD-10-CM

## 2014-05-27 DIAGNOSIS — R519 Headache, unspecified: Secondary | ICD-10-CM

## 2014-05-27 DIAGNOSIS — J329 Chronic sinusitis, unspecified: Secondary | ICD-10-CM | POA: Diagnosis not present

## 2014-05-27 DIAGNOSIS — Q048 Other specified congenital malformations of brain: Secondary | ICD-10-CM | POA: Insufficient documentation

## 2014-05-27 DIAGNOSIS — J0140 Acute pansinusitis, unspecified: Secondary | ICD-10-CM

## 2014-05-27 MED ORDER — AMOXICILLIN-POT CLAVULANATE 875-125 MG PO TABS
1.0000 | ORAL_TABLET | Freq: Two times a day (BID) | ORAL | Status: DC
Start: 2014-05-27 — End: 2014-10-01

## 2014-05-27 NOTE — Progress Notes (Signed)
[facility] [address], [place] [address] [phone]  Date:  05/27/2014   Name:  Nicholas Shaw Heinrich   DOB:  07-23-1990   MRN:  [id number]  PCP:  Willow OraJose Paz, MD    Chief Complaint: Migraine   History of Present Illness:  Nicholas Shaw Klindt is a 23 y.o. very pleasant male patient who presents with the following:  No prior history of migraines or headaches in general.  Over the past two weeks has developed recurring bitemporal headaches He characterizes the pain as throbbing and associated with sensitivity to bright lights and loud noises Last three hours or so and go away Has no fever or chills.  No nausea or vomiting.  No stool change, antecedent illness Says at the same time the headaches started, he started wrestling.  Has no history of closed head injury or unconsciousness No neuro or visual symptoms.  No slurred speech. No improvement with over the counter medications or other home remedies.  Denies other complaint or health concern today.   Patient Active Problem List   Diagnosis Date Noted  . 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 Unspecified disorder of skin and subcutaneous tissue 03/01/2014  . Elevated BP 03/01/2014    No past medical history on file.  Past Surgical History  Procedure Laterality Date  . Negative      History  Substance Use Topics  . Smoking status: Never Smoker   . Smokeless tobacco: Not on file  . Alcohol Use: Yes     Comment: occ    Family History  Problem Relation Age of Onset  . Hypertension Other     No Known Allergies  Medication list has been reviewed and updated.  No current outpatient prescriptions on file prior to visit.   No current facility-administered medications on file prior to visit.    Review of Systems:  As per HPI, otherwise negative.    Physical Examination: Filed Vitals:   05/27/14 1403  BP: 132/82  Pulse: 64  Temp: 98.7 F (37.1 C)  Resp: 17   Filed Vitals:   05/27/14 1403   Height: 6' (1.829 m)  Weight: 286 lb (129.729 kg)   Body mass index is 38.78 kg/(m^2). Ideal Body Weight: Weight in (lb) to have BMI = 25: 183.9  GEN: WDWN, NAD, Non-toxic, A & O x 3 HEENT: Atraumatic, Normocephalic. Neck supple. No masses, No LAD. Ears and Nose: No external deformity. CV: RRR, No M/G/R. No JVD. No thrill. No extra heart sounds. PULM: CTA B, no wheezes, crackles, rhonchi. No retractions. No resp. distress. No accessory muscle use. ABD: S, NT, ND, +BS. No rebound. No HSM. EXTR: No c/c/e NEURO Normal gait, balance and coordination.  PRRERLA EOMI   CN 2-12 intact PSYCH: Normally interactive. Conversant. Not depressed or anxious appearing.  Calm demeanor.    Assessment and Plan: Diffuse sinusitis on MR Suggestion of benign intracranial hypotension If headache persists follow up in one week   Signed,  Phillips OdorJeffery Milea Klink, MD

## 2014-05-27 NOTE — Progress Notes (Signed)
Spoke with Denny PeonErin at MD office. Dr Dareen Pianoanderson was sitting right be side her. Told her to let Dr Dareen Pianoanderson known that the report is finalized and in epic for him to look at and call pt with findings. When office scheduled appt. Instructed us to send pt home after scan.

## 2014-05-27 NOTE — Patient Instructions (Signed)

## 2014-09-22 ENCOUNTER — Ambulatory Visit: Payer: Federal, State, Local not specified - PPO | Admitting: Medical

## 2014-09-28 ENCOUNTER — Encounter: Payer: Self-pay | Admitting: Medical

## 2014-09-28 ENCOUNTER — Telehealth: Payer: Self-pay | Admitting: Medical

## 2014-09-28 NOTE — Telephone Encounter (Signed)
Pt was no show for acute visit on 09/22/14- letter sent- charge?

## 2014-09-28 NOTE — Telephone Encounter (Signed)
Charge. 

## 2014-10-01 ENCOUNTER — Ambulatory Visit (INDEPENDENT_AMBULATORY_CARE_PROVIDER_SITE_OTHER): Payer: Federal, State, Local not specified - PPO | Admitting: Family Medicine

## 2014-10-01 VITALS — BP 136/80 | HR 59 | Temp 97.9°F | Ht 71.5 in | Wt 254.0 lb

## 2014-10-01 DIAGNOSIS — S39012A Strain of muscle, fascia and tendon of lower back, initial encounter: Secondary | ICD-10-CM

## 2014-10-01 DIAGNOSIS — M545 Low back pain, unspecified: Secondary | ICD-10-CM

## 2014-10-01 MED ORDER — METHOCARBAMOL 500 MG PO TABS: ORAL_TABLET | ORAL | Status: DC

## 2014-10-01 MED ORDER — NAPROXEN 500 MG PO TABS: 500.0000 mg | ORAL_TABLET | Freq: Two times a day (BID) | ORAL | Status: DC

## 2014-10-01 NOTE — Progress Notes (Signed)
Subjective: 24 year old man who works at [employer]. He does case picking type work. The other day, yesterday, when he was getting ready to work he had severe pain develop in the lower lumbar region there was no specific injury. He has had back pain problems in the past. However over the last months and year he has lost a lot of weight intentionally to try and take better care of his back and body.  Objective: Healthy-appearing young no major distress. No CVA tenderness. He is tender to percussion on the lower lumbar spine but not palpitations of the spinal muscles. Range of motion is fair but he has pain on extension of the spine. Deep tendon reflexes symmetrical 1+.  Assessment: Lumbar strain and pain  Plan: Methocarbamol Naproxen Return if not improving Stay off work for a few days to relax the back.

## 2014-10-01 NOTE — Patient Instructions (Signed)
Stay off work for the next 3 days and try and rest your back  Take the muscle relaxant 2 at bedtime and 1 pill 3 times daily (methocarbamol)  Take the naproxen one twice daily for pain and inflammation  Read the booklet on taking care of your back  Continue the good work at losing weight  If you're not improving over the next week please return  Always use caution with lifting

## 2014-11-11 ENCOUNTER — Encounter: Payer: Self-pay | Admitting: Medical

## 2014-11-11 ENCOUNTER — Ambulatory Visit (INDEPENDENT_AMBULATORY_CARE_PROVIDER_SITE_OTHER): Payer: Federal, State, Local not specified - PPO | Admitting: Medical

## 2014-11-11 ENCOUNTER — Ambulatory Visit (HOSPITAL_BASED_OUTPATIENT_CLINIC_OR_DEPARTMENT_OTHER)
Admission: RE | Admit: 2014-11-11 | Discharge: 2014-11-11 | Disposition: A | Discharge status: Home / Self Care | Payer: Federal, State, Local not specified - PPO | Source: Ambulatory Visit | Attending: Medical | Admitting: Medical

## 2014-11-11 VITALS — BP 144/82 | HR 60 | Temp 98.4°F | Ht 71.5 in | Wt 246.8 lb

## 2014-11-11 DIAGNOSIS — M545 Low back pain, unspecified: Secondary | ICD-10-CM

## 2014-11-11 MED ORDER — CYCLOBENZAPRINE HCL 5 MG PO TABS: ORAL_TABLET | ORAL | Status: AC

## 2014-11-11 MED ORDER — DICLOFENAC SODIUM 75 MG PO TBEC: 75.0000 mg | DELAYED_RELEASE_TABLET | Freq: Two times a day (BID) | ORAL | Status: AC

## 2014-11-11 NOTE — Progress Notes (Signed)
   Subjective:    Patient ID: Nicholas Shaw, Nicholas Shaw    DOB: Oct 15, 1990, 24 y.o.   MRN: [id number]  HPI  Pt in with lower back pain. Pain started about 1 month ago. Pain just started one day. No fall or injury. He does work at [employer]. He picking up some moderate heavy items. Pt went to urgent care on [address]. No xray done. Pt never got prescription filled. Pain slight more than at beginning.  Pain level is about 5/10. No radiating pain no legs. No saddle anesthesia.     Review of Systems  Constitutional: Negative for fever, chills and fatigue.  Respiratory: Negative for cough, chest tightness and wheezing.   Cardiovascular: Negative for chest pain and palpitations.  Gastrointestinal: Negative for nausea, vomiting and abdominal pain.  Genitourinary: Negative for dysuria, urgency, hematuria and flank pain.  Musculoskeletal: Positive for back pain.  Neurological: Negative for weakness and numbness.       Objective:   Physical Exam   General Appearance- Not in acute distress.    Chest and Lung Exam Auscultation: Breath sounds:-Normal. Clear even and unlabored. Adventitious sounds:- No Adventitious sounds.  Cardiovascular Auscultation:Rythm - Regular, rate and rythm. Heart Sounds -Normal heart sounds.  Abdomen Inspection:-Inspection Normal.  Palpation/Perucssion: Palpation and Percussion of the abdomen reveal- Non Tender, No Rebound tenderness, No rigidity(Guarding) and No Palpable abdominal masses.  Liver:-Normal.  Spleen:- Normal.   Back Mid lumbar spine tenderness to palpation. No pain on straight leg lift. Pain on lateral movements and flexion/extension of the spine.  Lower ext neurologic  L5-S1 sensation intact bilaterally. Normal patellar reflexes bilaterally. No foot drop bilaterally.        Assessment & Plan:   Back pain- since present for one mont will get lumbar xray. Rx diclofenac and flexeril. Back stretching exercises. Follow  up in 2 wks or as needed. If xray negative and symptoms persist consider PT.

## 2014-11-11 NOTE — Progress Notes (Signed)
Pre visit review using our clinic review tool, if applicable. No additional management support is needed unless otherwise documented below in the visit note. 

## 2014-11-11 NOTE — Patient Instructions (Signed)
Back pain- since present for one mont will get lumbar xray. Rx diclofenac and flexeril. Back stretching exercises. Follow up in 2 wks or as needed. If xray negative and symptoms persist consider PT.

## 2015-02-27 ENCOUNTER — Ambulatory Visit (INDEPENDENT_AMBULATORY_CARE_PROVIDER_SITE_OTHER): Payer: Federal, State, Local not specified - PPO | Admitting: Family Medicine

## 2015-02-27 VITALS — BP 140/80 | HR 93 | Temp 98.5°F | Resp 20 | Ht 71.0 in | Wt 228.4 lb

## 2015-02-27 DIAGNOSIS — J029 Acute pharyngitis, unspecified: Secondary | ICD-10-CM | POA: Diagnosis not present

## 2015-02-27 LAB — POCT RAPID STREP A (OFFICE): RAPID STREP A SCREEN: NEGATIVE

## 2015-02-27 MED ORDER — AMOXICILLIN 875 MG PO TABS: 875.0000 mg | ORAL_TABLET | Freq: Two times a day (BID) | ORAL | Status: AC

## 2015-02-27 NOTE — Progress Notes (Signed)
Sore throat Subjective:  Patient ID: Nicholas Shaw, male    DOB: 10-20-1990  Age: 24 y.o. MRN: [id number]   24 year old male who has been having a sore throat that started earlier in the week. Then it felt a little better, then yesterday and today it's been worse. They looked his throat at work and told him to go home and come see the doctor. He is not running a fever. It hurts mostly on the right side of throat.   Objective:    no acute distress. He lives with his brother who is not sick. His TMs are normal. Throat erythematous especially on the right tonsil which is much bigger than the left. He has a small node which is tender underneath his chin. His chest is clear. Heart regular without murmurs.  Assessment & Plan:   Assessment:   pharyngitis  None strep on rapid test Results for orders placed or performed in visit on 02/27/15  POCT rapid strep A  Result Value Ref Range   Rapid Strep A Screen Negative Negative    Plan:   treat until we get the full culture back  There are no Patient Instructions on file for this visit.   Clothilde Tippetts, MD 02/27/2015

## 2015-02-27 NOTE — Patient Instructions (Signed)
Drink plenty of fluids.  Take Tylenol 1000 mg 3 times daily or ibuprofen 800 mg 3 times daily as needed for pain  Continue using lozenges as needed    take the amoxicillin 875 mg 1 twice daily. If we call you that the throat culture itself was negative then you can discontinue the amoxicillin and for the remainder away. If we call you that it is positive it is very important to continue the full 10 days of medicine.    return as needed

## 2015-03-01 LAB — CULTURE, GROUP A STREP

## 2015-09-05 ENCOUNTER — Telehealth: Payer: Self-pay | Admitting: Internal Medicine

## 2015-09-05 NOTE — Telephone Encounter (Signed)
LM for pt to call and schedule flu shot or update records. °

## 2016-10-04 IMAGING — DX DG LUMBAR SPINE 2-3V
3 series · 3 of 3 positions shown · non-contrast
Comparison: 03/31/2014

CLINICAL DATA: 23-year-old male with a history of lumbar back pain

EXAM:
LUMBAR SPINE - 2-3 VIEW

[l-spine ap]
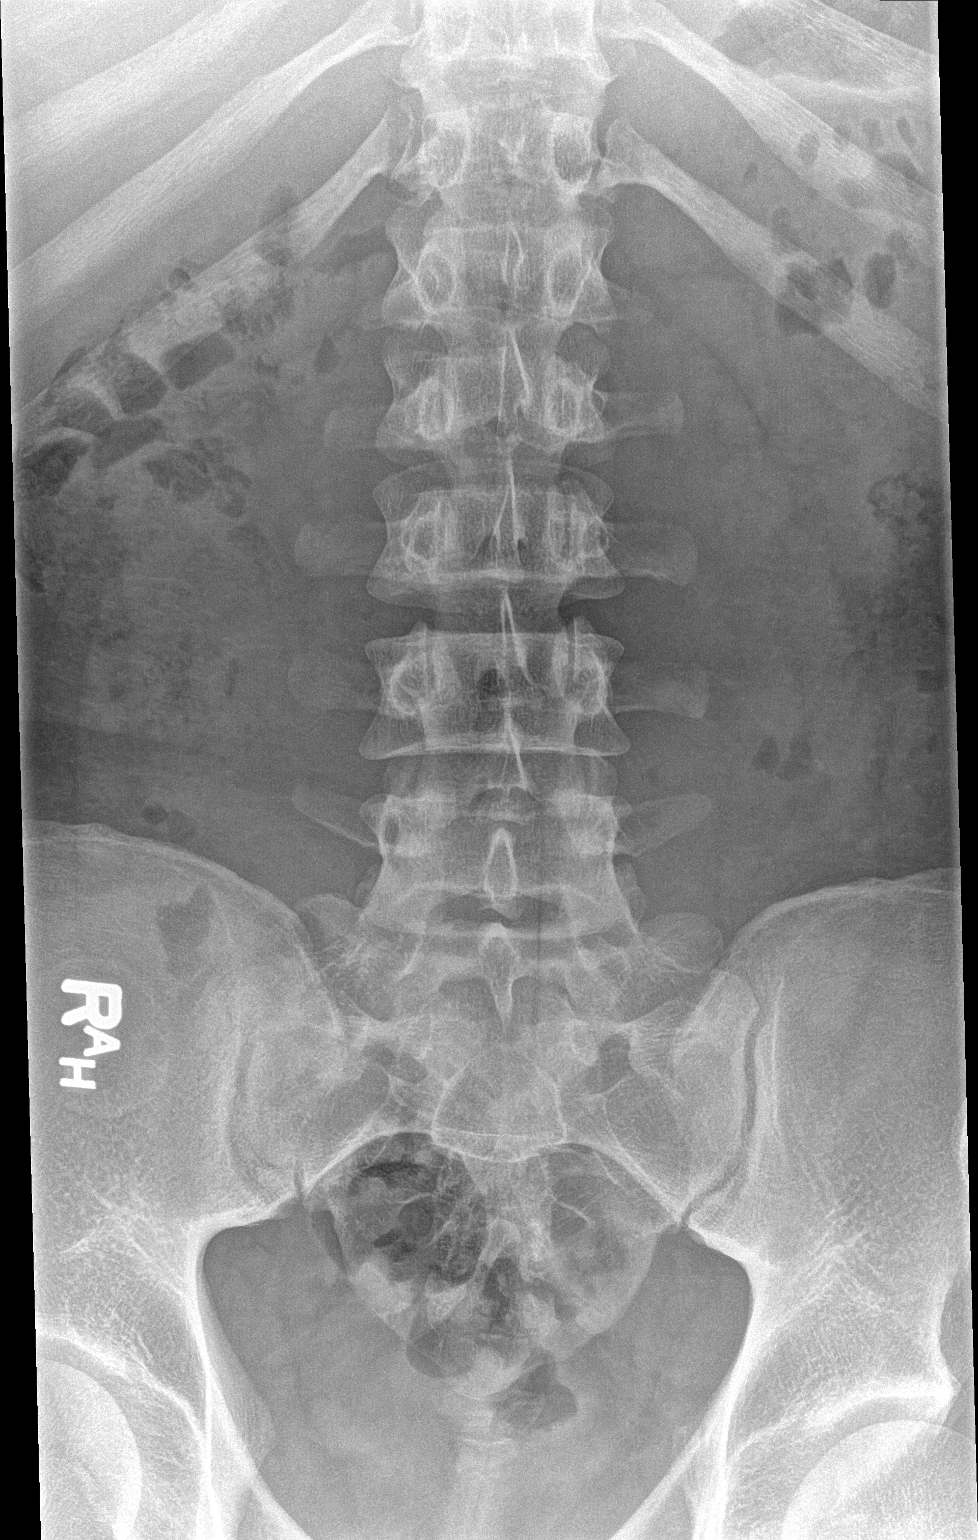

[l-spine lat]
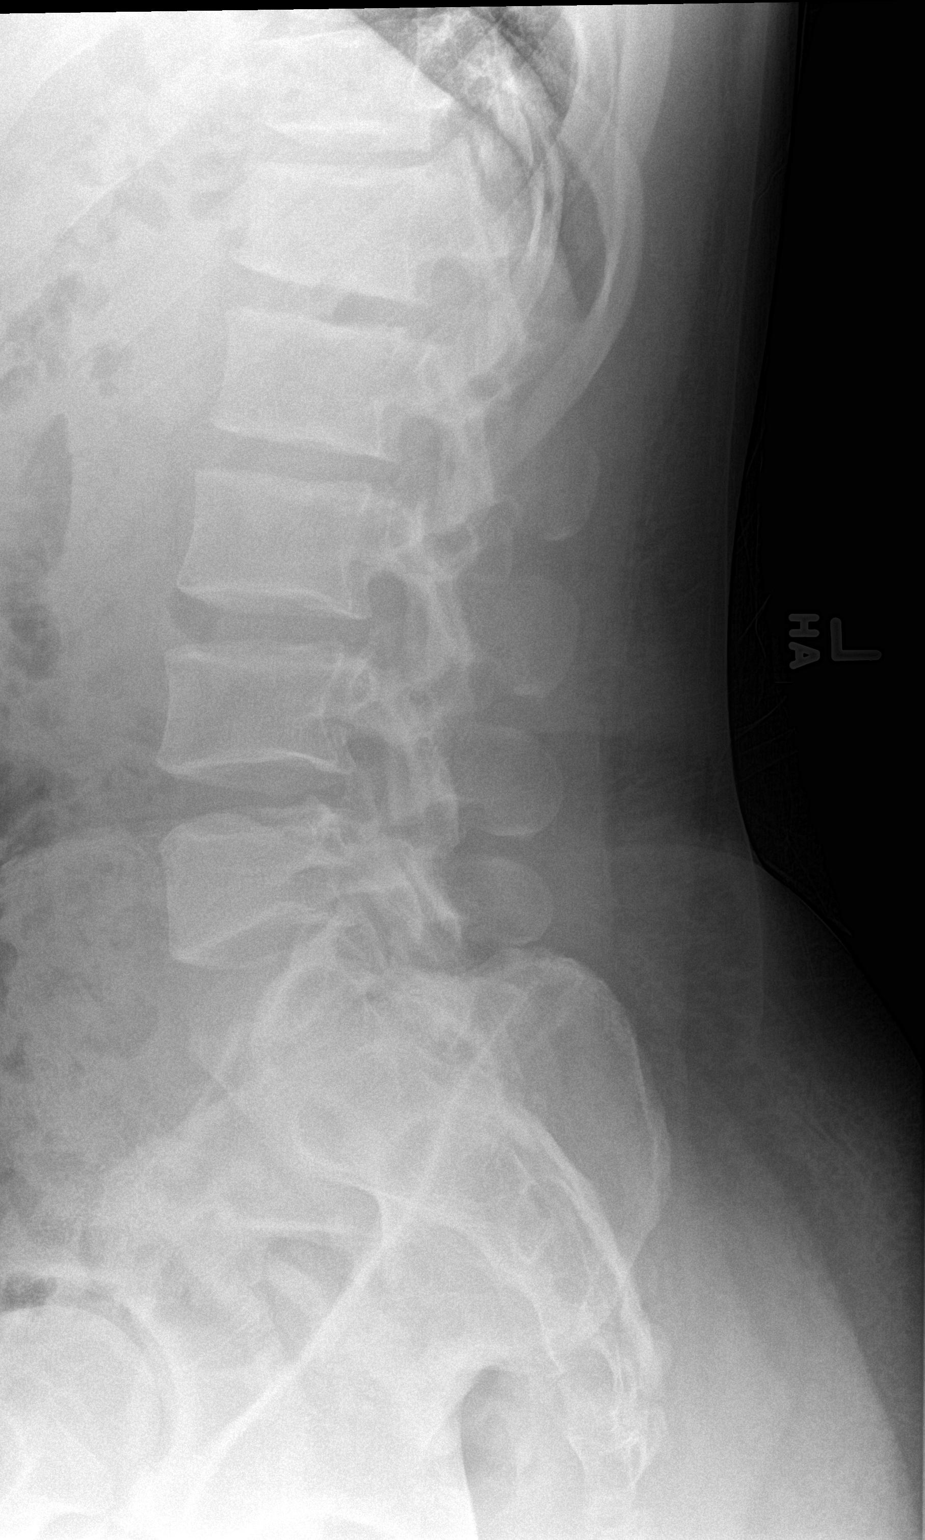

[l-spine spot]
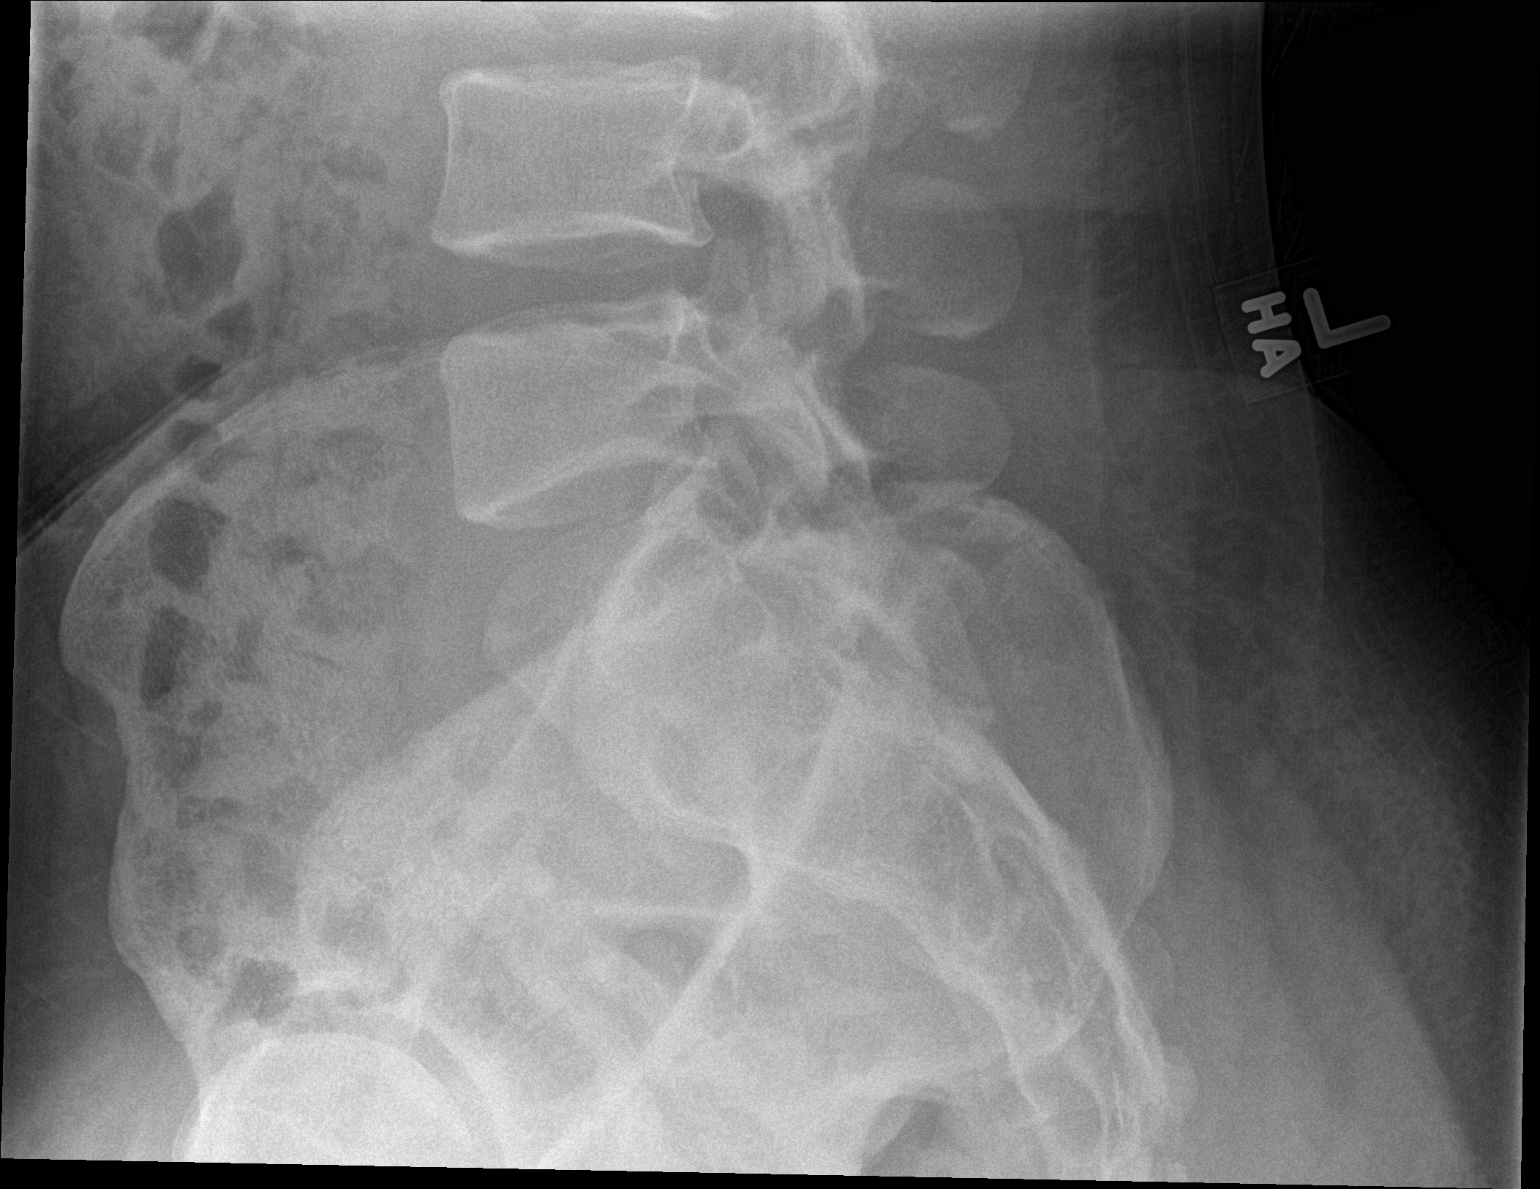

[3 of 3 positions shown; findings below may reference images not displayed]

FINDINGS: Lumbar Spine:

Lumbar vertebral elements maintain normal alignment without evidence
of anterolisthesis, retrolisthesis, subluxation.

No fracture line identified. Vertebral body heights maintained as
well as disc space heights.

No significant degenerative disc disease or endplate changes. No
significant facet changes.

Unremarkable appearance of the visualized abdomen.

Again demonstrated partial lumbarization of the first sacral
element.
IMPRESSION: No acute fracture or malalignment of the lumbar spine.
